# Patient Record
Sex: Female | Born: 1995 | Race: White | Hispanic: No | Marital: Single | Smoking: Never smoker
Health system: Southern US, Community
[De-identification: ages and names within clinical notes are randomized; demographics above are authoritative.]

## PROBLEM LIST (undated history)

## (undated) ENCOUNTER — Ambulatory Visit: Admission: EM | Payer: Medicaid Other

## (undated) DIAGNOSIS — F909 Attention-deficit hyperactivity disorder, unspecified type: Secondary | ICD-10-CM

## (undated) DIAGNOSIS — Z8619 Personal history of other infectious and parasitic diseases: Secondary | ICD-10-CM

## (undated) DIAGNOSIS — F329 Major depressive disorder, single episode, unspecified: Secondary | ICD-10-CM

## (undated) DIAGNOSIS — F319 Bipolar disorder, unspecified: Secondary | ICD-10-CM

## (undated) DIAGNOSIS — F32A Depression, unspecified: Secondary | ICD-10-CM

## (undated) DIAGNOSIS — G479 Sleep disorder, unspecified: Secondary | ICD-10-CM

## (undated) HISTORY — DX: Personal history of other infectious and parasitic diseases: Z86.19

## (undated) HISTORY — PX: NO PAST SURGERIES: SHX2092

---
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
## 2017-11-14 ENCOUNTER — Encounter (HOSPITAL_COMMUNITY): Payer: Self-pay | Admitting: Behavioral Health

## 2017-11-14 ENCOUNTER — Inpatient Hospital Stay (HOSPITAL_COMMUNITY)
Admission: AD | Admit: 2017-11-14 | Discharge: 2017-11-18 | DRG: 885 | Disposition: A | Discharge status: Home / Self Care | Payer: Federal, State, Local not specified - Other | Source: Intra-hospital | Attending: Psychiatry | Admitting: Psychiatry

## 2017-11-14 ENCOUNTER — Encounter (HOSPITAL_COMMUNITY): Payer: Self-pay

## 2017-11-14 ENCOUNTER — Emergency Department (HOSPITAL_COMMUNITY)
Admission: EM | Admit: 2017-11-14 | Discharge: 2017-11-14 | Disposition: A | Discharge status: Other Acute Inpatient Hospital | Payer: Self-pay | Attending: Emergency Medicine | Admitting: Emergency Medicine

## 2017-11-14 ENCOUNTER — Other Ambulatory Visit: Payer: Self-pay

## 2017-11-14 DIAGNOSIS — F332 Major depressive disorder, recurrent severe without psychotic features: Secondary | ICD-10-CM | POA: Diagnosis present

## 2017-11-14 DIAGNOSIS — Z915 Personal history of self-harm: Secondary | ICD-10-CM

## 2017-11-14 DIAGNOSIS — Z630 Problems in relationship with spouse or partner: Secondary | ICD-10-CM | POA: Diagnosis not present

## 2017-11-14 DIAGNOSIS — G4700 Insomnia, unspecified: Secondary | ICD-10-CM | POA: Diagnosis present

## 2017-11-14 DIAGNOSIS — R45851 Suicidal ideations: Secondary | ICD-10-CM | POA: Diagnosis present

## 2017-11-14 DIAGNOSIS — J45909 Unspecified asthma, uncomplicated: Secondary | ICD-10-CM | POA: Diagnosis present

## 2017-11-14 DIAGNOSIS — Z818 Family history of other mental and behavioral disorders: Secondary | ICD-10-CM | POA: Diagnosis not present

## 2017-11-14 DIAGNOSIS — Z8659 Personal history of other mental and behavioral disorders: Secondary | ICD-10-CM | POA: Diagnosis not present

## 2017-11-14 DIAGNOSIS — F411 Generalized anxiety disorder: Secondary | ICD-10-CM | POA: Diagnosis present

## 2017-11-14 DIAGNOSIS — F909 Attention-deficit hyperactivity disorder, unspecified type: Secondary | ICD-10-CM | POA: Insufficient documentation

## 2017-11-14 LAB — SALICYLATE LEVEL

## 2017-11-14 LAB — COMPREHENSIVE METABOLIC PANEL
ALBUMIN: 4.3 g/dL (ref 3.5–5.0)
ALK PHOS: 79 U/L (ref 38–126)
ALT: 18 U/L (ref 0–44)
ANION GAP: 9 (ref 5–15)
AST: 21 U/L (ref 15–41)
BUN: 9 mg/dL (ref 6–20)
CHLORIDE: 111 mmol/L (ref 98–111)
CO2: 22 mmol/L (ref 22–32)
Calcium: 9.6 mg/dL (ref 8.9–10.3)
Creatinine, Ser: 0.74 mg/dL (ref 0.44–1.00)
GFR calc Af Amer: >60 mL/min (ref >60)
GFR calc non Af Amer: >60 mL/min (ref >60)
GLUCOSE: 92 mg/dL (ref 70–99)
POTASSIUM: 3.9 mmol/L (ref 3.5–5.1)
SODIUM: 142 mmol/L (ref 135–145)
Total Bilirubin: 1 mg/dL (ref 0.3–1.2)
Total Protein: 7.5 g/dL (ref 6.5–8.1)

## 2017-11-14 LAB — ACETAMINOPHEN LEVEL

## 2017-11-14 LAB — RAPID URINE DRUG SCREEN, HOSP PERFORMED
Amphetamines: NOT DETECTED
BARBITURATES: NOT DETECTED
BENZODIAZEPINES: NOT DETECTED
COCAINE: NOT DETECTED
OPIATES: NOT DETECTED
Tetrahydrocannabinol: NOT DETECTED

## 2017-11-14 LAB — CBC
HCT: 39.9 % (ref 36.0–46.0)
HEMOGLOBIN: 13.9 g/dL (ref 12.0–15.0)
MCH: 30.6 pg (ref 26.0–34.0)
MCHC: 34.8 g/dL (ref 30.0–36.0)
MCV: 87.9 fL (ref 78.0–100.0)
Platelets: 214 10*3/uL (ref 150–400)
RBC: 4.54 MIL/uL (ref 3.87–5.11)
RDW: 13 % (ref 11.5–15.5)
WBC: 9.7 10*3/uL (ref 4.0–10.5)

## 2017-11-14 LAB — ETHANOL: Alcohol, Ethyl (B): <10 mg/dL (ref <10)

## 2017-11-14 LAB — I-STAT BETA HCG BLOOD, ED (MC, WL, AP ONLY): I-stat hCG, quantitative: <5 m[IU]/mL (ref <5)

## 2017-11-14 MED ORDER — INFLUENZA VAC SPLIT QUAD 0.5 ML IM SUSY
0.5000 mL | PREFILLED_SYRINGE | INTRAMUSCULAR | Status: DC
  Filled 2017-11-14: qty 0.5

## 2017-11-14 MED ORDER — ACETAMINOPHEN 325 MG PO TABS: 650.0000 mg | ORAL_TABLET | ORAL | Status: DC | PRN

## 2017-11-14 MED ORDER — MAGNESIUM HYDROXIDE 400 MG/5ML PO SUSP: 30.0000 mL | Freq: Every day | ORAL | Status: DC | PRN

## 2017-11-14 MED ORDER — ONDANSETRON HCL 4 MG PO TABS: 4.0000 mg | ORAL_TABLET | Freq: Three times a day (TID) | ORAL | Status: DC | PRN

## 2017-11-14 MED ORDER — ALUM & MAG HYDROXIDE-SIMETH 200-200-20 MG/5ML PO SUSP: 30.0000 mL | Freq: Four times a day (QID) | ORAL | Status: DC | PRN
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
## 2017-11-14 NOTE — ED Notes (Signed)
Pt was discharged in care of [facility] for transfer to [facility]. She signed for an received belongings, which were given to [employer] driver for transport. She verbalized readiness for transfer, was ambulatory, and in no acute distress.

## 2017-11-14 NOTE — Tx Team (Signed)
Initial Treatment Plan 11/14/2017 4:43 PM [name] AVW:[id number]    PATIENT STRESSORS: Marital or family conflict Medication change or noncompliance   PATIENT STRENGTHS: Ability for insight Capable of independent living   PATIENT IDENTIFIED PROBLEMS: "depression related to conflict with boyfriend"  "Suicidal thoughts related to increased depression"                   DISCHARGE CRITERIA:  Ability to meet basic life and health needs Adequate post-discharge living arrangements Improved stabilization in mood, thinking, and/or behavior  PRELIMINARY DISCHARGE PLAN: Attend aftercare/continuing care group Attend PHP/IOP  PATIENT/FAMILY INVOLVEMENT: This treatment plan has been presented to and reviewed with the patient, Christie Hart, and/or family member.  The patient and family have been given the opportunity to ask questions and make suggestions.  Layla Barter, RN 11/14/2017, 4:43 PM

## 2017-11-14 NOTE — ED Notes (Signed)
Oriented pt to unit after she finished speaking with TTS. Pt came in for SI. She denies HI/AVH and contracts for safety. Pt is quiet and cooperative. She is now on the phone with her mother. Will continue to monitor for needs/safety.

## 2017-11-14 NOTE — ED Notes (Signed)
Report called to Rodell Perna, Charity fundraiser, at [facility]. [facility] called.

## 2017-11-14 NOTE — BH Assessment (Signed)
[facility] Assessment Progress Note    11/14/17 Per Nanine Means, NP, Patient meets psychiatric admission criteria and has been accepted to [facility] 406-2 @ 15:00.

## 2017-11-14 NOTE — Progress Notes (Signed)
D: Pt stayed in room majority of the evening.  A: Pt was offered support and encouragement. Pt was given scheduled medications. Pt was encourage to attend groups. Q 15 minute checks were done for safety.  R:  safety maintained on unit.

## 2017-11-14 NOTE — Progress Notes (Signed)
Adult Psychoeducational Group Note  Date:  11/14/2017 Time:  10:27 PM  Group Topic/Focus:  Wrap-Up Group:   The focus of this group is to help patients review their daily goal of treatment and discuss progress on daily workbooks.  Additional Comments:  Pt did not attend group.   Berlin Hun 11/14/2017, 10:27 PM

## 2017-11-14 NOTE — ED Notes (Signed)
One bag in locker 28.
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
## 2017-11-15 DIAGNOSIS — Z818 Family history of other mental and behavioral disorders: Secondary | ICD-10-CM

## 2017-11-15 MED ORDER — MIRTAZAPINE 7.5 MG PO TABS
7.5000 mg | ORAL_TABLET | Freq: Every day | ORAL | Status: DC
  Administered 2017-11-16 – 2017-11-17 (×2): 7.5 mg via ORAL
  Filled 2017-11-15: qty 1
  Filled 2017-11-15: qty 7
  Filled 2017-11-15 (×6): qty 1

## 2017-11-15 MED ORDER — LORAZEPAM 0.5 MG PO TABS
0.5000 mg | ORAL_TABLET | Freq: Four times a day (QID) | ORAL | Status: DC | PRN
  Administered 2017-11-16: 0.5 mg via ORAL
  Filled 2017-11-15: qty 1

## 2017-11-15 NOTE — BHH Counselor (Signed)
Adult Comprehensive Assessment  Patient ID: Christie Hart, female   DOB: 1995-11-09, 22 y.o.   MRN: [id number]  Information Source: Information source: Patient  Current Stressors:  Patient states their primary concerns and needs for treatment are:: "Help me make good decisions about this relationship." Patient states their goals for this hospitilization and ongoing recovery are:: Handle my depression and anxiety better Social relationships: Pt reports problems in her relationship with boyfriend, arguing, he thinks she is being unfaithful.    Living/Environment/Situation:  Living Arrangements: Parent, Spouse/significant other Living conditions (as described by patient or guardian): There is some conflict between pt boyfriend and pt mom. Who else lives in the home?: mother, step father, pt boyfriend How long has patient lived in current situation?: 3 months What is atmosphere in current home: Chaotic, Supportive  Family History:  Marital status: Long term relationship Long term relationship, how long?: 18 months What types of issues is patient dealing with in the relationship?: boyfriend worried pt is being unfaithful, very suspicious Are you sexually active?: Yes What is your sexual orientation?: heterosexual Has your sexual activity been affected by drugs, alcohol, medication, or emotional stress?: no Does patient have children?: No  Childhood History:  Additional childhood history information: Parents divorced when pt was 36.  With mom after that.  Mom remarried quickly.  Good childhood overall.   Description of patient's relationship with caregiver when they were a child: mom: good, dad: good Patient's description of current relationship with people who raised him/her: mom: good, dad: lives in [place], not a lot of contact, but good relationship How were you disciplined when you got in trouble as a child/adolescent?: appropriate discipline Does patient have siblings?:  Yes Number of Siblings: 1 Description of patient's current relationship with siblings: older sister, she is with father in [place], no recent contact.  "wont talk to me or my mom" Did patient suffer any verbal/emotional/physical/sexual abuse as a child?: No Did patient suffer from severe childhood neglect?: No Has patient ever been sexually abused/assaulted/raped as an adolescent or adult?: No Was the patient ever a victim of a crime or a disaster?: No Witnessed domestic violence?: No Has patient been effected by domestic violence as an adult?: No  Education:  Highest grade of school patient has completed: HS diploma Currently a student?: No Learning disability?: Yes What learning problems does patient have?: was diagnosed--math issue  Employment/Work Situation:   Employment situation: Employed Where is patient currently employed?: 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 Did You Receive Any Psychiatric Treatment/Services While in Equities trader?: No Are There Guns or Other Weapons in Your Home?: No  Financial Resources:   Financial resources: Income from employment, Support from parents / caregiver(boyfriend works) Does patient have a Lawyer or guardian?: No  Alcohol/Substance Abuse:   What has been your use of drugs/alcohol within the last 12 months?: alcohol: pt denies, drugs: pt denies If attempted suicide, did drugs/alcohol play a role in this?: No Alcohol/Substance Abuse Treatment Hx: Denies past history Has alcohol/substance abuse ever caused legal problems?: No  Social Support System:   Conservation officer, nature Support System: Fair Development worker, community Support System: mom Type of faith/religion: none How does patient's faith help to cope with current illness?: na  Leisure/Recreation:    Leisure and Hobbies: watch movies, fish  Strengths/Needs:  What is the patient's perception of their strengths?: building friendships, working, having goals Patient states they can use these personal strengths during their treatment to contribute to their recovery: trying to figure out what she wants in her current relationship Patient states these barriers may affect/interfere with their treatment: none Patient states these barriers may affect their return to the community: none Other important information patient would like considered in planning for their treatment: none  Discharge Plan:   Currently receiving community mental health services: No Patient states concerns and preferences for aftercare planning are: Pt would like to go to [place].  Went to [facility] services before and had to switch therapists all the time. Patient states they will know when they are safe and ready for discharge when: when I can handle my depression and anxiety Does patient have access to transportation?: Yes Does patient have financial barriers related to discharge medications?: Yes Patient description of barriers related to discharge medications: no insurance Will patient be returning to same living situation after discharge?: Yes  Summary/Recommendations:   Summary and Recommendations (to be completed by the evaluator): Pt is 22 year old female from [place].  Pt is diagnosed with major depressive disorder and was admitted due to increased depression and suicidal ideation.  Pt reports primary stressor is her relationship with her boyfriend.  Recommendations for pt include crisis stabilization, therapeutic milieu, attend and participate in groups, medication management, and development of comprehensive mental wellness plan.  Lorri Frederick. 11/15/2017

## 2017-11-15 NOTE — BHH Suicide Risk Assessment (Signed)
[facility] Admission Suicide Risk Assessment   Nursing information obtained from:  Patient Demographic factors:  Low socioeconomic status, Adolescent or young adult, Caucasian Current Mental Status:  Suicidal ideation indicated by patient, Suicidal ideation indicated by others, Intention to act on suicide plan Loss Factors:  NA Historical Factors:  Prior suicide attempts, Impulsivity Risk Reduction Factors:  Sense of responsibility to family, Living with another person, especially a relative  Total Time spent with patient: 45 minutes Principal Problem:  MDD Diagnosis:   Patient Active Problem List   Diagnosis Date Noted  . Major depressive disorder, recurrent severe without psychotic features (HCC) [F33.2] 11/14/2017   Subjective Data:   Continued Clinical Symptoms:  Alcohol Use Disorder Identification Test Final Score (AUDIT): 0 The "Alcohol Use Disorders Identification Test", Guidelines for Use in Primary Care, Second Edition.  World Science writer [facility]). Score between 0-7:  no or low risk or alcohol related problems. Score between 8-15:  moderate risk of alcohol related problems. Score between 16-19:  high risk of alcohol related problems. Score 20 or above:  warrants further diagnostic evaluation for alcohol dependence and treatment.   CLINICAL FACTORS:  22 year old single female, lives with boyfriend, parents. Presented voluntarily due to increased depression, anxiety, passive SI in the context of significant stressors, namely frequent arguments with her boyfriend, whom she states has been jealous , distrustful, accusatory. She states she impulsively ingested window cleaner chemical product 2 weeks ago during an argument, but did not consult treatment at the time   Psychiatric Specialty Exam: Physical Exam  ROS  Blood pressure 124/77, pulse (!) 118, temperature 99 F (37.2 C), temperature source Oral, resp. rate 20, height 5\' 8"  (1.727 m), weight 58.5 kg.Body mass index is  19.61 kg/m.  See admit note MSE   COGNITIVE FEATURES THAT CONTRIBUTE TO RISK:  Closed-mindedness and Loss of executive function    SUICIDE RISK:   Moderate:  Frequent suicidal ideation with limited intensity, and duration, some specificity in terms of plans, no associated intent, good self-control, limited dysphoria/symptomatology, some risk factors present, and identifiable protective factors, including available and accessible social support.  PLAN OF CARE: Patient will be admitted to inpatient psychiatric unit for stabilization and safety. Will provide and encourage milieu participation. Provide medication management and maked adjustments as needed.  Will follow daily.    I certify that inpatient services furnished can reasonably be expected to improve the patient's condition.   Craige Cotta, MD 11/15/2017, 2:39 PM

## 2017-11-15 NOTE — H&P (Signed)
Psychiatric Admission Assessment Adult  Patient Identification: Christie Hart MRN:  [id number] Date of Evaluation:  11/15/2017 Chief Complaint:  " I just could not take it anymore" Principal Diagnosis:  MDD, without psychotic features  Diagnosis:   Patient Active Problem List   Diagnosis Date Noted  . Major depressive disorder, recurrent severe without psychotic features ([place]) [F33.2] 11/14/2017   History of Present Illness: 22 year old single female, lives with [place], boyfriend. States she felt acutely depressed and anxious yesterday in the context of argument with her BF. States she had already been feeling depressed over recent weeks, with thoughts of wanting to " just leave , disappear", but denies having had formed or discrete  suicidal plans or intentions. She has also had episodes of panic symptoms, with sense of shortness of breath, chest tightness and severe anxiety. States that about two weeks ago she impulsively ingested some Windex during an argument- states she did not consult at the time. Reports she has been facing significant stress regarding her relationship with her boyfriend, whom she describes as jealous, accusing her of infidelity, resulting in " a lot of arguing . States he insults her , calling her names, looking for marks or clues on her body to determine if she has been with someone else, but has not been physically assaultive towards her.  Endorses some neuro-vegetative symptoms of depression as below  Associated Signs/Symptoms: Depression Symptoms:  depressed mood, insomnia, suicidal thoughts without plan, anxiety, (Hypo) Manic Symptoms:  Denies and none noted Anxiety Symptoms:  Increased anxiety recently , with increased worry and panic episodes  Psychotic Symptoms:  Denies  PTSD Symptoms: Denies  Total Time spent with patient: 45 minutes  Past Psychiatric History:  No prior psychiatric admissions, states she has never attempted suicide, no  history of self cutting or of self injurious behaviors , no history of violence , states she has had prior episodes of depression, usually lasting " a few days". Denies history of mania, denies history of PTSD, denies history of violence, denies history of eating disorder . Denies agoraphobia or social anxiety. In the past was  treated with Seroquel for " my mood and because I had difficulty sleeping".   Is the patient at risk to self? Yes.    Has the patient been a risk to self in the past 6 months? No.  Has the patient been a risk to self within the distant past? No.  Is the patient a risk to others? No.  Has the patient been a risk to others in the past 6 months? No.  Has the patient been a risk to others within the distant past? No.   Prior Inpatient Therapy:  denies  Prior Outpatient Therapy:  no current outpatient treatment.  Alcohol Screening: 1. How often do you have a drink containing alcohol?: Never 2. How many drinks containing alcohol do you have on a typical day when you are drinking?: 1 or 2 3. How often do you have six or more drinks on one occasion?: Never AUDIT-C Score: 0 9. Have you or someone else been injured as a result of your drinking?: No 10. Has a relative or friend or a doctor or another health worker been concerned about your drinking or suggested you cut down?: No Alcohol Use Disorder Identification Test Final Score (AUDIT): 0 Intervention/Follow-up: AUDIT Score <7 follow-up not indicated Substance Abuse History in the last 12 months: denies alcohol or drug abuse  Consequences of Substance Abuse: Denies  Previous Psychotropic  Medications: was not taking any psychiatric medications prior to admission, states that in the past she has been on Seroquel, Strattera, Klonopin 1-2 years ago. Psychological Evaluations: no  Past Medical History: Denies medical illnesses, NKDA  Past Medical History:  Diagnosis Date  . ADHD (attention deficit hyperactivity disorder)   .  Bipolar affective disorder ([place])   . Depression   . Sleep difficulties    History reviewed. No pertinent surgical history. Family History: parents separated when patient 16. Lives with mother and stepfather. Has one sister who lives in [place] with biological father. Family Psychiatric  History: sister and mother have history of depression, no suicides in family, no alcohol abuse in family  Tobacco Screening: Have you used any form of tobacco in the last 30 days? (Cigarettes, Smokeless Tobacco, Cigars, and/or Pipes): No Social History: 21, single, no children, lives with Control and instrumentation engineer and her boyfriend, employed at a [employer]. Denies legal issues. Social History   Substance and Sexual Activity  Alcohol Use No     Social History   Substance and Sexual Activity  Drug Use No    Additional Social History:  Allergies:  No Known Allergies Lab Results:  Results for orders placed or performed during the hospital encounter of 11/14/17 (from the past 48 hour(s))  Comprehensive metabolic panel     Status: None   Collection Time: 11/14/17 11:47 AM  Result Value Ref Range   Sodium 142 135 - 145 mmol/L   Potassium 3.9 3.5 - 5.1 mmol/L   Chloride 111 98 - 111 mmol/L   CO2 22 22 - 32 mmol/L   Glucose, Bld 92 70 - 99 mg/dL   BUN 9 6 - 20 mg/dL   Creatinine, Ser 0.74 0.44 - 1.00 mg/dL   Calcium 9.6 8.9 - 10.3 mg/dL   Total Protein 7.5 6.5 - 8.1 g/dL   Albumin 4.3 3.5 - 5.0 g/dL   AST 21 15 - 41 U/L   ALT 18 0 - 44 U/L   Alkaline Phosphatase 79 38 - 126 U/L   Total Bilirubin 1.0 0.3 - 1.2 mg/dL   GFR calc non Af Amer >60 >60 mL/min   GFR calc Af Amer >60 >60 mL/min    Comment: (NOTE) The eGFR has been calculated using the CKD EPI equation. This calculation has not been validated in all clinical situations. eGFR's persistently <60 mL/min signify possible Chronic Kidney Disease.    Anion gap 9 5 - 15    Comment: Performed at [facility], [place] [address],  [place], [place] [address]  Ethanol     Status: None   Collection Time: 11/14/17 11:47 AM  Result Value Ref Range   Alcohol, Ethyl (B) <10 <10 mg/dL    Comment: (NOTE) Lowest detectable limit for serum alcohol is 10 mg/dL. For medical purposes only. Performed at [facility], [place] [address] [place], [place] [address]   Salicylate level     Status: None   Collection Time: 11/14/17 11:47 AM  Result Value Ref Range   Salicylate Lvl <9.3 2.8 - 30.0 mg/dL    Comment: Performed at [facility], [place] [address], [place], [place] [address]  Acetaminophen level     Status: Abnormal   Collection Time: 11/14/17 11:47 AM  Result Value Ref Range   Acetaminophen (Tylenol), Serum <10 (L) 10 - 30 ug/mL    Comment: (NOTE) Therapeutic concentrations vary significantly. A range of 10-30 ug/mL  may be an effective concentration for many  patients. However, some  are best treated at concentrations outside of this range. Acetaminophen concentrations >150 ug/mL at 4 hours after ingestion  and >50 ug/mL at 12 hours after ingestion are often associated with  toxic reactions. Performed at [facility], [place] [address], [place], [place] [address]   cbc     Status: None   Collection Time: 11/14/17 11:47 AM  Result Value Ref Range   WBC 9.7 4.0 - 10.5 K/uL   RBC 4.54 3.87 - 5.11 MIL/uL   Hemoglobin 13.9 12.0 - 15.0 g/dL   HCT 39.9 36.0 - 46.0 %   MCV 87.9 78.0 - 100.0 fL   MCH 30.6 26.0 - 34.0 pg   MCHC 34.8 30.0 - 36.0 g/dL   RDW 13.0 11.5 - 15.5 %   Platelets 214 150 - 400 K/uL    Comment: Performed at [facility], [place] [address], [place], [place] [address]  Rapid urine drug screen (hospital performed)     Status: None   Collection Time: 11/14/17 11:47 AM  Result Value Ref Range   Opiates NONE DETECTED NONE DETECTED   Cocaine NONE DETECTED NONE DETECTED   Benzodiazepines NONE DETECTED NONE DETECTED   Amphetamines NONE DETECTED  NONE DETECTED   Tetrahydrocannabinol NONE DETECTED NONE DETECTED   Barbiturates NONE DETECTED NONE DETECTED    Comment: (NOTE) DRUG SCREEN FOR MEDICAL PURPOSES ONLY.  IF CONFIRMATION IS NEEDED FOR ANY PURPOSE, NOTIFY LAB WITHIN 5 DAYS. LOWEST DETECTABLE LIMITS FOR URINE DRUG SCREEN Drug Class                     Cutoff (ng/mL) Amphetamine and metabolites    1000 Barbiturate and metabolites    200 Benzodiazepine                 979 Tricyclics and metabolites     300 Opiates and metabolites        300 Cocaine and metabolites        300 THC                            50 Performed at [facility], [place] [address], [place],  [address]   I-Stat beta hCG blood, ED     Status: None   Collection Time: 11/14/17 11:54 AM  Result Value Ref Range   I-stat hCG, quantitative <5.0 <5 mIU/mL   Comment 3            Comment:   GEST. AGE      CONC.  (mIU/mL)   <=1 WEEK        5 - 50     2 WEEKS       50 - 500     3 WEEKS       100 - 10,000     4 WEEKS     1,000 - 30,000        FEMALE AND NON-PREGNANT FEMALE:     LESS THAN 5 mIU/mL     Blood Alcohol level:  Lab Results  Component Value Date   ETH <10 94/17/4081    Metabolic Disorder Labs:  No results found for: HGBA1C, MPG No results found for: PROLACTIN No results found for: CHOL, TRIG, HDL, CHOLHDL, VLDL, LDLCALC  Current Medications: Current Facility-Administered Medications  Medication Dose Route Frequency Provider Last Rate Last Dose  . acetaminophen (TYLENOL) tablet 650 mg  650 mg Oral Q4H  PRN Patrecia Pour, NP      . alum & mag hydroxide-simeth (MAALOX/MYLANTA) 200-200-20 MG/5ML suspension 30 mL  30 mL Oral Q6H PRN Patrecia Pour, NP      . Influenza vac split quadrivalent PF (FLUARIX) injection 0.5 mL  0.5 mL Intramuscular Tomorrow-1000 Cobos, Fernando A, MD      . magnesium hydroxide (MILK OF MAGNESIA) suspension 30 mL  30 mL Oral Daily PRN Patrecia Pour, NP      . ondansetron [facility]) tablet 4  mg  4 mg Oral Q8H PRN Patrecia Pour, NP       PTA Medications: Medications Prior to Admission  Medication Sig Dispense Refill Last Dose  . albuterol (PROVENTIL HFA;VENTOLIN HFA) 108 (90 BASE) MCG/ACT inhaler Inhale 1-2 puffs into the lungs every 6 (six) hours as needed for wheezing. (Patient not taking: Reported on 11/14/2017) 1 Inhaler 0 Not Taking at Unknown time  . predniSONE (DELTASONE) 20 MG tablet Take 2 tablets (40 mg total) by mouth daily. (Patient not taking: Reported on 11/14/2017) 10 tablet 0 Not Taking at Unknown time    Musculoskeletal: Strength & Muscle Tone: within normal limits Gait & Station: normal Patient leans: N/A  Psychiatric Specialty Exam: Physical Exam  Review of Systems  Constitutional: Negative.   HENT: Negative.   Eyes: Negative.   Respiratory: Negative.   Cardiovascular: Negative.   Gastrointestinal: Negative.   Genitourinary: Negative.   Musculoskeletal: Negative.   Skin: Negative.   Neurological: Negative for dizziness, seizures and headaches.  Endo/Heme/Allergies: Negative.   Psychiatric/Behavioral: Positive for depression and suicidal ideas. The patient is nervous/anxious.   All other systems reviewed and are negative.   Blood pressure 124/77, pulse (!) 118, temperature 99 F (37.2 C), temperature source Oral, resp. rate 20, height '5\' 8"'$  (1.727 m), weight 58.5 kg.Body mass index is 19.61 kg/m.  General Appearance: Fairly Groomed  Eye Contact:  Good  Speech:  Normal Rate  Volume:  Decreased  Mood:  depressed, anxious, states feeling better today  Affect:  anxious, vaguely constricted   Thought Process:  Linear and Descriptions of Associations: Intact  Orientation:  Other:  fully alert and attentive   Thought Content:  no hallucinations,no delusions, not internally preoccupied   Suicidal Thoughts:  No denies suicidal ideations , denies self injurious ideations, no homicidal ideations  Homicidal Thoughts:  No  Memory:  recent and remote grossly  intact   Judgement:  Fair  Insight:  Fair  Psychomotor Activity:  Decreased  Concentration:  Concentration: Good and Attention Span: Good  Recall:  Good  Fund of Knowledge:  Good  Language:  Good  Akathisia:  Negative  Handed:  Right  AIMS (if indicated):     Assets:  Communication Skills Desire for Improvement Resilience  ADL's:  Intact  Cognition:  WNL  Sleep:  Number of Hours: 6.25    Treatment Plan Summary: Daily contact with patient to assess and evaluate symptoms and progress in treatment, Medication management, Plan inpatient treatment and medications as below  Observation Level/Precautions:  15 minute checks  Laboratory:  as needed   Psychotherapy:  [place], group therapy   Medications:  Agrees to antidepressant medication trial- we discussed trial- agrees to REMERON trial Start Remeron 7.5 mgrs QHS for depression Ativan PRN for anxiety   Consultations:  As needed   Discharge Concerns:  -  Estimated LOS: 4-5 days   Other:     Physician Treatment Plan for Primary Diagnosis:  MDD Long Term Goal(s): Improvement  in symptoms so as ready for discharge  Short Term Goals: Ability to identify changes in lifestyle to reduce recurrence of condition will improve and Ability to maintain clinical measurements within normal limits will improve  Physician Treatment Plan for Secondary Diagnosis: MDD  Long Term Goal(s): Improvement in symptoms so as ready for discharge  Short Term Goals: Ability to identify changes in lifestyle to reduce recurrence of condition will improve, Ability to verbalize feelings will improve, Ability to disclose and discuss suicidal ideas, Ability to demonstrate self-control will improve, Ability to identify and develop effective coping behaviors will improve and Ability to maintain clinical measurements within normal limits will improve  I certify that inpatient services furnished can reasonably be expected to improve the patient's condition.    Jenne Campus, MD 9/9/20192:08 PM

## 2017-11-15 NOTE — Plan of Care (Signed)
Problem: Education: Goal: Knowledge of [facility] Education information/materials will improve Outcome: Progressing   Problem: Safety: Goal: Periods of time without injury will increase Outcome: Progressing D: Pt awake and visible in dayroom this afternoon. Isolative to room majority of this shift. A & O X4. Denies SI, HI, AVH and pain when assessed. Presents guarded with flat affect and depressed mood. Rates her depression 2/10, anxiety and hopelessness 0/10. Reports she slept well last night with poor appetite, normal energy and good concentration level. Declined her scheduled flu vaccine when offered this AM "I want to think about it first because last time I took it I got sick". Pt updated on changes made to treatment regimen (Labs, meds). Pt's goal this shift is "How to handle my relationship with my boyfriend".  A: Emotional support and availability provided to pt as needed. Encouraged pt to voice concerns, attend to ADLs and comply with current treatment regimen. Safety checks maintained without self harm gestures.  R: Pt remains safe on unit. Denies concerns at this time.

## 2017-11-15 NOTE — Progress Notes (Signed)
Recreation Therapy Notes  Date: 9.9.19 Time: 0930 Location: 300 [address] Dayroom  Group Topic: Stress Management  Goal Area(s) Addresses:  Patient will verbalize importance of using healthy stress management.  Patient will identify positive emotions associated with healthy stress management.   Intervention: Stress Management  Activity :  Mountain Meditation.  LRT introduced the stress management technique of meditation.  Patients were to listen as meditation played to engage in the activity.  Education: Stress Management, Discharge Planning.   Education Outcome: Acknowledges edcuation/In group clarification offered/Needs additional education  Clinical Observations/Feedback: Pt did not attend group.    Linzi Ohlinger, LRT/CTRS        Jaysha Lasure A 11/15/2017 12:18 PM 

## 2017-11-15 NOTE — Tx Team (Addendum)
Interdisciplinary Treatment and Diagnostic Plan Update  11/15/2017 Time of Session: 1053 Christie Hart MRN: [id number]  Principal Diagnosis: <principal problem not specified>  Secondary Diagnoses: Active Problems:   Major depressive disorder, recurrent severe without psychotic features ([place])   Current Medications:  Current Facility-Administered Medications  Medication Dose Route Frequency Provider Last Rate Last Dose  . acetaminophen (TYLENOL) tablet 650 mg  650 mg Oral Q4H PRN Patrecia Pour, NP      . alum & mag hydroxide-simeth (MAALOX/MYLANTA) 200-200-20 MG/5ML suspension 30 mL  30 mL Oral Q6H PRN Patrecia Pour, NP      . Influenza vac split quadrivalent PF (FLUARIX) injection 0.5 mL  0.5 mL Intramuscular Tomorrow-1000 Cobos, Fernando A, MD      . LORazepam (ATIVAN) tablet 0.5 mg  0.5 mg Oral Q6H PRN Cobos, Myer Peer, MD      . magnesium hydroxide (MILK OF MAGNESIA) suspension 30 mL  30 mL Oral Daily PRN Patrecia Pour, NP      . mirtazapine (REMERON) tablet 7.5 mg  7.5 mg Oral QHS Cobos, Myer Peer, MD      . ondansetron (ZOFRAN) tablet 4 mg  4 mg Oral Q8H PRN Patrecia Pour, NP       PTA Medications: Medications Prior to Admission  Medication Sig Dispense Refill Last Dose  . albuterol (PROVENTIL HFA;VENTOLIN HFA) 108 (90 BASE) MCG/ACT inhaler Inhale 1-2 puffs into the lungs every 6 (six) hours as needed for wheezing. (Patient not taking: Reported on 11/14/2017) 1 Inhaler 0 Not Taking at Unknown time  . predniSONE (DELTASONE) 20 MG tablet Take 2 tablets (40 mg total) by mouth daily. (Patient not taking: Reported on 11/14/2017) 10 tablet 0 Not Taking at Unknown time    Patient Stressors: Marital or family conflict Medication change or noncompliance  Patient Strengths: Ability for insight Capable of independent living  Treatment Modalities: Medication Management, Group therapy, Case management,  1 to 1 session with clinician, Psychoeducation, Recreational  therapy.   Physician Treatment Plan for Primary Diagnosis: <principal problem not specified> Long Term Goal(s): Improvement in symptoms so as ready for discharge Improvement in symptoms so as ready for discharge   Short Term Goals: Ability to identify changes in lifestyle to reduce recurrence of condition will improve Ability to maintain clinical measurements within normal limits will improve Ability to identify changes in lifestyle to reduce recurrence of condition will improve Ability to verbalize feelings will improve Ability to disclose and discuss suicidal ideas Ability to demonstrate self-control will improve Ability to identify and develop effective coping behaviors will improve Ability to maintain clinical measurements within normal limits will improve  Medication Management: Evaluate patient's response, side effects, and tolerance of medication regimen.  Therapeutic Interventions: 1 to 1 sessions, Unit Group sessions and Medication administration.  Evaluation of Outcomes: Not Met  Physician Treatment Plan for Secondary Diagnosis: Active Problems:   Major depressive disorder, recurrent severe without psychotic features ([place])  Long Term Goal(s): Improvement in symptoms so as ready for discharge Improvement in symptoms so as ready for discharge   Short Term Goals: Ability to identify changes in lifestyle to reduce recurrence of condition will improve Ability to maintain clinical measurements within normal limits will improve Ability to identify changes in lifestyle to reduce recurrence of condition will improve Ability to verbalize feelings will improve Ability to disclose and discuss suicidal ideas Ability to demonstrate self-control will improve Ability to identify and develop effective coping behaviors will improve Ability to maintain clinical measurements  within normal limits will improve     Medication Management: Evaluate patient's response, side effects, and  tolerance of medication regimen.  Therapeutic Interventions: 1 to 1 sessions, Unit Group sessions and Medication administration.  Evaluation of Outcomes: Not Met   RN Treatment Plan for Primary Diagnosis: <principal problem not specified> Long Term Goal(s): Knowledge of disease and therapeutic regimen to maintain health will improve  Short Term Goals: Ability to identify and develop effective coping behaviors will improve and Compliance with prescribed medications will improve  Medication Management: RN will administer medications as ordered by provider, will assess and evaluate patient's response and provide education to patient for prescribed medication. RN will report any adverse and/or side effects to prescribing provider.  Therapeutic Interventions: 1 on 1 counseling sessions, Psychoeducation, Medication administration, Evaluate responses to treatment, Monitor vital signs and CBGs as ordered, Perform/monitor CIWA, COWS, AIMS and Fall Risk screenings as ordered, Perform wound care treatments as ordered.  Evaluation of Outcomes: Not Met   LCSW Treatment Plan for Primary Diagnosis: <principal problem not specified> Long Term Goal(s): Safe transition to appropriate next level of care at discharge, Engage patient in therapeutic group addressing interpersonal concerns.  Short Term Goals: Engage patient in aftercare planning with referrals and resources, Increase social support and Increase skills for wellness and recovery  Therapeutic Interventions: Assess for all discharge needs, 1 to 1 time with Social worker, Explore available resources and support systems, Assess for adequacy in community support network, Educate family and significant other(s) on suicide prevention, Complete Psychosocial Assessment, Interpersonal group therapy.  Evaluation of Outcomes: Not Met   Progress in Treatment: Attending groups: No. Participating in groups: No. Taking medication as prescribed:  No. Toleration medication: No. Family/Significant other contact made: No, will contact:  when given permission Patient understands diagnosis: Yes. Discussing patient identified problems/goals with staff: Yes. Medical problems stabilized or resolved: Yes. Denies suicidal/homicidal ideation: Yes. Issues/concerns per patient self-inventory: No. Other: none  New problem(s) identified: No, Describe:  none  New Short Term/Long Term Goal(s):  Patient Goals:  "handle my depression and anxiety better.  Get along better with boyfriend and family"  Discharge Plan or Barriers:   Reason for Continuation of Hospitalization: Depression Medication stabilization  Estimated Length of Stay: 3-5 days.   Attendees: Patient: Christie Hart 11/15/2017   Physician: Dr. Parke Poisson, MD 11/15/2017   Nursing: Mayra Neer, RN 11/15/2017   RN Care Manager: 11/15/2017   Social Worker: Lurline Idol, LCSW 11/15/2017   Recreational Therapist:  11/15/2017   Other:  11/15/2017   Other:  11/15/2017   Other: 11/15/2017        Scribe for Treatment Team: Joanne Chars, [place] 11/15/2017 3:43 PM

## 2017-11-16 DIAGNOSIS — F332 Major depressive disorder, recurrent severe without psychotic features: Principal | ICD-10-CM

## 2017-11-16 LAB — TSH: TSH: 0.751 u[IU]/mL (ref 0.350–4.500)

## 2017-11-16 NOTE — [facility] Group Notes (Signed)
LCSW Group Therapy Note 11/16/2017 11:23 AM  Type of Therapy and Topic: Group Therapy: Overcoming Obstacles  Participation Level: Active  Description of Group:  In this group patients will be encouraged to explore what they see as obstacles to their own wellness and recovery. They will be guided to discuss their thoughts, feelings, and behaviors related to these obstacles. The group will process together ways to cope with barriers, with attention given to specific choices patients can make. Each patient will be challenged to identify changes they are motivated to make in order to overcome their obstacles. This group will be process-oriented, with patients participating in exploration of their own experiences as well as giving and receiving support and challenge from other group members.  Therapeutic Goals: 1. Patient will identify personal and current obstacles as they relate to admission. 2. Patient will identify barriers that currently interfere with their wellness or overcoming obstacles.  3. Patient will identify feelings, thought process and behaviors related to these barriers. 4. Patient will identify two changes they are willing to make to overcome these obstacles:   Summary of Patient Progress  Christie Hart was engaged and participated throughout the group session. Christie Hart reports that her current obstacle is her inability to manage negative and positive relationships in her life. Christie Hart reports that her current barrier is the conflict between her parents and her current boyfriend. Christie Hart reports she does not know what she will do to overcome this barrier because she loves her boyfriend, however she does not want to damage the relationship she has with her parents either.    Therapeutic Modalities:  Cognitive Behavioral Therapy Solution Focused Therapy Motivational Interviewing Relapse Prevention Therapy   Alcario Drought Clinical Social Worker

## 2017-11-16 NOTE — Progress Notes (Signed)
[facility] MD Progress Note  11/16/2017 4:06 PM Christie Hart  MRN:  [id number] Subjective: Patient is seen and examined.  Patient is a 22 year old female with a past psychiatric history significant for depression and anxiety.  She was admitted on 9/9 after developing suicidal ideation in the context of an argument with her boyfriend.  She states she felt as though she just wanted to "disappear".  She also has a history of panic attack symptoms.  She also has some significant impulsive behavior where she ingested some Windex after an argument last week.  She stated that she had previously been treated for anxiety depression in the past.  She had been treated for almost 9 years with Seroquel, clonazepam and Strattera.  She stated she been off those for a couple years.  We discussed the possibility of going on those.  Dr. Jama Flavors wrote for mirtazapine, but she did not take it last night.  She stated she felt better today, and had no suicidal ideation.  We discussed the need to consider being back on medicines given her previous treatment.  She stated she did stop the medicines because she lost her Medicaid. Principal Problem: <principal problem not specified> Diagnosis:   Patient Active Problem List   Diagnosis Date Noted  . Major depressive disorder, recurrent severe without psychotic features (HCC) [F33.2] 11/14/2017   Total Time spent with patient: 15 minutes  Past Psychiatric History: See admission H&P  Past Medical History:  Past Medical History:  Diagnosis Date  . ADHD (attention deficit hyperactivity disorder)   . Bipolar affective disorder (HCC)   . Depression   . Sleep difficulties    History reviewed. No pertinent surgical history. Family History: History reviewed. No pertinent family history. Family Psychiatric  History: See admission H&P Social History:  Social History   Substance and Sexual Activity  Alcohol Use No     Social History   Substance and Sexual Activity  Drug Use  No    Social History   Socioeconomic History  . Marital status: Single    Spouse name: Not on file  . Number of children: Not on file  . Years of education: Not on file  . Highest education level: Not on file  Occupational History  . Not on file  Social Needs  . Financial resource strain: Not on file  . Food insecurity:    Worry: Not on file    Inability: Not on file  . Transportation needs:    Medical: Not on file    Non-medical: Not on file  Tobacco Use  . Smoking status: Never Smoker  . Smokeless tobacco: Never Used  Substance and Sexual Activity  . Alcohol use: No  . Drug use: No  . Sexual activity: Not on file  Lifestyle  . Physical activity:    Days per week: Not on file    Minutes per session: Not on file  . Stress: Not on file  Relationships  . Social connections:    Talks on phone: Not on file    Gets together: Not on file    Attends religious service: Not on file    Active member of club or organization: Not on file    Attends meetings of clubs or organizations: Not on file    Relationship status: Not on file  Other Topics Concern  . Not on file  Social History Narrative  . Not on file   Additional Social History:  Sleep: Fair  Appetite:  Fair  Current Medications: Current Facility-Administered Medications  Medication Dose Route Frequency Provider Last Rate Last Dose  . acetaminophen (TYLENOL) tablet 650 mg  650 mg Oral Q4H PRN Charm Rings, NP      . alum & mag hydroxide-simeth (MAALOX/MYLANTA) 200-200-20 MG/5ML suspension 30 mL  30 mL Oral Q6H PRN Charm Rings, NP      . Influenza vac split quadrivalent PF (FLUARIX) injection 0.5 mL  0.5 mL Intramuscular Tomorrow-1000 Cobos, Fernando A, MD      . LORazepam (ATIVAN) tablet 0.5 mg  0.5 mg Oral Q6H PRN Cobos, Rockey Situ, MD      . magnesium hydroxide (MILK OF MAGNESIA) suspension 30 mL  30 mL Oral Daily PRN Charm Rings, NP      . mirtazapine (REMERON)  tablet 7.5 mg  7.5 mg Oral QHS Cobos, Rockey Situ, MD      . ondansetron (ZOFRAN) tablet 4 mg  4 mg Oral Q8H PRN Charm Rings, NP        Lab Results:  Results for orders placed or performed during the hospital encounter of 11/14/17 (from the past 48 hour(s))  TSH     Status: None   Collection Time: 11/16/17  6:17 AM  Result Value Ref Range   TSH 0.751 0.350 - 4.500 uIU/mL    Comment: Performed by a 3rd Generation assay with a functional sensitivity of <=0.01 uIU/mL. Performed at [facility], [address] [place], [place] [address]     Blood Alcohol level:  Lab Results  Component Value Date   ETH <10 11/14/2017    Metabolic Disorder Labs: No results found for: HGBA1C, MPG No results found for: PROLACTIN No results found for: CHOL, TRIG, HDL, CHOLHDL, VLDL, LDLCALC  Physical Findings: AIMS: Facial and Oral Movements Muscles of Facial Expression: None, normal Lips and Perioral Area: None, normal Jaw: None, normal Tongue: None, normal,Extremity Movements Upper (arms, wrists, hands, fingers): None, normal Lower (legs, knees, ankles, toes): None, normal, Trunk Movements Neck, shoulders, hips: None, normal, Overall Severity Severity of abnormal movements (highest score from questions above): None, normal Incapacitation due to abnormal movements: None, normal Patient's awareness of abnormal movements (rate only patient's report): No Awareness, Dental Status Current problems with teeth and/or dentures?: No Does patient usually wear dentures?: No  CIWA:    COWS:     Musculoskeletal: Strength & Muscle Tone: within normal limits Gait & Station: normal Patient leans: N/A  Psychiatric Specialty Exam: Physical Exam  Nursing note and vitals reviewed. Constitutional: She is oriented to person, place, and time. She appears well-developed.  HENT:  Head: Normocephalic and atraumatic.  Respiratory: Effort normal.  Neurological: She is alert and oriented to  person, place, and time.    ROS  Blood pressure 102/70, pulse (!) 127, temperature 98.9 F (37.2 C), temperature source Oral, resp. rate 18, height 5\' 8"  (1.727 m), weight 58.5 kg.Body mass index is 19.61 kg/m.  General Appearance: Casual  Eye Contact:  Fair  Speech:  Normal Rate  Volume:  Normal  Mood:  Anxious  Affect:  Congruent  Thought Process:  Coherent and Descriptions of Associations: Intact  Orientation:  Full (Time, Place, and Person)  Thought Content:  Logical  Suicidal Thoughts:  No  Homicidal Thoughts:  No  Memory:  Immediate;   Fair Recent;   Fair Remote;   Fair  Judgement:  Intact  Insight:  Lacking  Psychomotor Activity:  Normal  Concentration:  Concentration: Fair and Attention Span: Fair  Recall:  [employer] of Knowledge:  Fair  Language:  Fair  Akathisia:  Negative  Handed:  Right  AIMS (if indicated):     Assets:  Communication Skills Desire for Improvement Housing Physical Health Resilience Social Support  ADL's:  Intact  Cognition:  WNL  Sleep:  Number of Hours: 6.75     Treatment Plan Summary: Daily contact with patient to assess and evaluate symptoms and progress in treatment, Medication management and Plan : Patient is seen and examined.  Patient is a 22 year old female with the above-stated past psychiatric history who is seen in follow-up.  #1-depression/anxiety-patient refused to take the mirtazapine last night, and have asked her to consider trying it tonight.  She states she had been treated with Seroquel, Klonopin as well as Strattera in the past.  She states she only stopped them because her Medicaid ran out.  I suggested that she could probably take the mirtazapine and achieve anxiety control, depression control as well as sedation with it.  She will consider taking that tonight.  She also has lorazepam written 0.5 mg every 6 hours as needed anxiety, but she has yet to use that as well.  #2 disposition planning-the patient is already asking  for discharge, and I asked her to consider taking the medication tonight so at least we can see whether we could help her with her anxiety.  We should do that before we plan necessarily any discharge issues.  Antonieta Pert, MD 11/16/2017, 4:06 PM

## 2017-11-16 NOTE — [facility] Suicide Risk Assessment (Signed)
[facility] INPATIENT:  Family/Significant Other Suicide Prevention Education  Suicide Prevention Education:  Education Completed; Germaine Pomfret Corriveau, mother, [phone], has been identified by the patient as the family member/significant other with whom the patient will be residing, and identified as the person(s) who will aid the patient in the event of a mental health crisis (suicidal ideations/suicide attempt).  With written consent from the patient, the family member/significant other has been provided the following suicide prevention education, prior to the and/or following the discharge of the patient.  The suicide prevention education provided includes the following:  Suicide risk factors  Suicide prevention and interventions  National Suicide Hotline telephone number  [facility] assessment telephone number  [facility] Emergency Assistance 911  [facility] and/or Residential Mobile Crisis Unit telephone number  Request made of family/significant other to:  Remove weapons (e.g., guns, rifles, knives), all items previously/currently identified as safety concern.  No guns in the home, per mom.  Remove drugs/medications (over-the-counter, prescriptions, illicit drugs), all items previously/currently identified as a safety concern.  The family member/significant other verbalizes understanding of the suicide prevention education information provided.  The family member/significant other agrees to remove the items of safety concern listed above.  Per mom, boyfriend and pt moved in 3 months ago.  They argue a lot.  Boyfriend, Domingo Dimes, only works sporadically.  Alexsia has said she feels like she is supporting him by her working all the time.  Mom did not want Dylan to know that pt went to [facility] and refused to tell him--Dylan ended up punching the wall.  CSW asked about having a family meeting to discuss this and she and her husband agreed to come in Wed at 1pm to meet with pt  and CSW.  Mother is concerned about her daughter in this relationship and says boyfriend has cost her a car and other things too.  Mother just found out about pt drinking bleach in prior suicide gesture.  Lorri Frederick, LCSW 11/16/2017, 1:12 PM

## 2017-11-16 NOTE — Progress Notes (Signed)
Patient denies SI, HI and AVH this shift.  Patient has been isolative to room this shift.   Assess patient for safety, offer medications planned, engage patient in 1:1 staff talks.   Continue to monitor as planned.  Patient able to contract for safety.

## 2017-11-16 NOTE — Progress Notes (Signed)
Adult Psychoeducational Group Note  Date:  11/16/2017 Time:  3:07 AM  Group Topic/Focus:  Wrap-Up Group:   The focus of this group is to help patients review their daily goal of treatment and discuss progress on daily workbooks.  Participation Level:  Did Not Attend Christie Hart 11/16/2017, 3:07 AM

## 2017-11-16 NOTE — [facility] Group Notes (Signed)
Adult Psychoeducational Group Note  Date:  11/16/2017  Time: 3:15 PM  Group Topic/Focus: Self-Care  Participation Level:  Minimal  Participation Quality:  Minimal  Affect:  Flat, Depressed  Cognitive:  Alert and Oriented  Insight: Improving  Engagement in Group:  Developing/Improving  Modes of Intervention:  Discussion and Education  Additional Comments:  Patient completed self-care assessment and contributed to the group discussion. Patient states she would like to take time to grocery shop for herself and do laundry on a routine basis.  Marchelle Folks A Lashanna Angelo 11/16/2017 4:00 PM

## 2017-11-16 NOTE — Plan of Care (Signed)
Progress Note  D: pt found in her room; tearful. Pt's boyfriend came to visit tonight and gave her an ultimatum where either he leaves or the pt has to leave with him and move out of her parent's house. Pt states she doesn't know what to do. Pt is torn between the two. Pt allowed to express grief and anxiety. Pt states the reason she is here is to have a break from him but he found out where she was and has been visiting the last two nights. Pt denies any si/hi/ah/vh and verbally agrees to approach staff if these become apparent. Pt compliant with medication administration. A: pt provided support and encouragement. Pt given medications per protocol and standing orders. Q47m safety checks implemented and continued. R: pt safe on the unit. Will continue to monitor.   Pt progressing in the following metrics  Problem: Education: Goal: Emotional status will improve Outcome: Progressing Goal: Mental status will improve Outcome: Progressing Goal: Verbalization of understanding the information provided will improve Outcome: Progressing   Problem: Coping: Goal: Ability to verbalize frustrations and anger appropriately will improve Outcome: Progressing Goal: Ability to demonstrate self-control will improve Outcome: Progressing   Problem: Health Behavior/Discharge Planning: Goal: Compliance with treatment plan for underlying cause of condition will improve Outcome: Progressing   Problem: Physical Regulation: Goal: Ability to maintain clinical measurements within normal limits will improve Outcome: Progressing

## 2017-11-16 NOTE — Progress Notes (Signed)
D: Pt denies SI/HI/AVH. Pt is pleasant and cooperative. Pt had minimal interaction with writer this evening. Pt visible on the unit on the phone a lot this evening.  A: Pt was offered support and encouragement . Pt was encourage to attend groups. Q 15 minute checks were done for safety.  R: safety maintained on unit.  Problem: Activity: Goal: Sleeping patterns will improve Outcome: Progressing   Problem: Activity: Goal: Interest or engagement in activities will improve Outcome: Progressing

## 2017-11-16 NOTE — Progress Notes (Signed)
Adult Psychoeducational Group Note  Date:  11/16/2017 Time:  7:10 PM  Group Topic/Focus:  Goals Group:   The focus of this group is to help patients establish daily goals to achieve during treatment and discuss how the patient can incorporate goal setting into their daily lives to aide in recovery.  Participation Level:  Active  Participation Quality:  Appropriate  Affect:  Appropriate  Cognitive:  Appropriate  Insight: Appropriate  Engagement in Group:  Engaged  Modes of Intervention:  Discussion  Additional Comments:  Pt attended group and participated in discussion.  Johnice Riebe R Natale Thoma 11/16/2017, 7:10 PM

## 2017-11-17 NOTE — Progress Notes (Signed)
Adult Psychoeducational Group Note  Date:  11/17/2017 Time:  4:13 AM  Group Topic/Focus:  Wrap-Up Group:   The focus of this group is to help patients review their daily goal of treatment and discuss progress on daily workbooks.  Participation Level:  Active  Participation Quality:  Appropriate  Affect:  Appropriate  Cognitive:  Appropriate  Insight: Appropriate  Engagement in Group:  Engaged  Modes of Intervention:  Discussion  Additional Comments:  Pt stated her goal for today was to attend all groups held. Pt stated she accomplished her goal today. Pt rated her overall day was a 10. Pt stated she enjoyed listen to her peers tell their own life stories doing group.  Christie Hart 11/17/2017, 4:13 AM

## 2017-11-17 NOTE — Progress Notes (Signed)
Patient ID: Christie Hart, female   DOB: Mar 18, 1995, 22 y.o.   MRN: [id number]  D: Pt visible in the day room interacting with peers, and denies SI/HI/AVH.  Pt is calm and cooperative, and denies any concerns.  A: Pt given all meds as scheduled, and is being maintained on Q15 minute checks for safety.  R: Will maintain on Q15 minute checks and continue to monitor.

## 2017-11-17 NOTE — Plan of Care (Signed)
Progress Note  D: pt found in her bed; compliant with medication administration. Pt states she slept well. 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 Pt denies any si/hi/ah/vh and verbally agrees to approach staff if these become apparent. Pt rates her depression/hopelessness/anxiety a 0/0/0 out of 10 respectively. Pt states her family visit went ok. Her mother gave her an ultimatum as well stating that she has to make a decision before she can leave here as stated by the patient. Pt is still torn as to what she should do. A: pt provided support and encouragement. Q28m safety checks implemented and continued. R: pt safe on the unit. Will continue to monitor.   Pt progressing in the following metrics  Problem: Health Behavior/Discharge Planning: Goal: Identification of resources available to assist in meeting health care needs will improve Outcome: Progressing   Problem: Education: Goal: Knowledge of [place] [facility] Education information/materials will improve Outcome: Progressing Goal: Emotional status will improve Outcome: Progressing Goal: Mental status will improve Outcome: Progressing Goal: Verbalization of understanding the information provided will improve Outcome: Progressing   Problem: Activity: Goal: Sleeping patterns will improve Outcome: Progressing
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
## 2017-11-17 NOTE — Progress Notes (Signed)
[facility] MD Progress Note  11/17/2017 12:19 PM Christie Hart  MRN:  [id number] Subjective: Patient is seen and examined.  Patient is a 22 year old female with a past psychiatric history significant for major depression as well as generalized anxiety.  She is seen in follow-up.  Last night she had a little difficulty.  She stated that the boyfriend came to visit, and he basically said he is unable to stay in the home with her mother, and that she would have to come with him and basically endure homelessness if she decided not to go home.  She was upset by this, but has thought about it.  She stated that she believes she needs to stay with her mother at this time.  She did take the Remeron last night, and she did sleep better.  She has been eating better.  She stated her mood was slowly improving.  She stated she knew that breaking up with the boyfriend would be difficult, but she understood she needed to do what was best for her.  The plan is for a family meeting this afternoon with the mother.  She denied any side effects to her current medications.  She denied any current suicidal ideation. Principal Problem: <principal problem not specified> Diagnosis:   Patient Active Problem List   Diagnosis Date Noted  . Major depressive disorder, recurrent severe without psychotic features (HCC) [F33.2] 11/14/2017   Total Time spent with patient: 15 minutes  Past Psychiatric History: See admission H&P  Past Medical History:  Past Medical History:  Diagnosis Date  . ADHD (attention deficit hyperactivity disorder)   . Bipolar affective disorder (HCC)   . Depression   . Sleep difficulties    History reviewed. No pertinent surgical history. Family History: History reviewed. No pertinent family history. Family Psychiatric  History: See admission H&P Social History:  Social History   Substance and Sexual Activity  Alcohol Use No     Social History   Substance and Sexual Activity  Drug Use No     Social History   Socioeconomic History  . Marital status: Single    Spouse name: Not on file  . Number of children: Not on file  . Years of education: Not on file  . Highest education level: Not on file  Occupational History  . Not on file  Social Needs  . Financial resource strain: Not on file  . Food insecurity:    Worry: Not on file    Inability: Not on file  . Transportation needs:    Medical: Not on file    Non-medical: Not on file  Tobacco Use  . Smoking status: Never Smoker  . Smokeless tobacco: Never Used  Substance and Sexual Activity  . Alcohol use: No  . Drug use: No  . Sexual activity: Not on file  Lifestyle  . Physical activity:    Days per week: Not on file    Minutes per session: Not on file  . Stress: Not on file  Relationships  . Social connections:    Talks on phone: Not on file    Gets together: Not on file    Attends religious service: Not on file    Active member of club or organization: Not on file    Attends meetings of clubs or organizations: Not on file    Relationship status: Not on file  Other Topics Concern  . Not on file  Social History Narrative  . Not on file   Additional Social  History:                         Sleep: Fair  Appetite:  Fair  Current Medications: Current Facility-Administered Medications  Medication Dose Route Frequency Provider Last Rate Last Dose  . acetaminophen (TYLENOL) tablet 650 mg  650 mg Oral Q4H PRN Charm Rings, NP      . alum & mag hydroxide-simeth (MAALOX/MYLANTA) 200-200-20 MG/5ML suspension 30 mL  30 mL Oral Q6H PRN Charm Rings, NP      . Influenza vac split quadrivalent PF (FLUARIX) injection 0.5 mL  0.5 mL Intramuscular Tomorrow-1000 Cobos, Fernando A, MD      . LORazepam (ATIVAN) tablet 0.5 mg  0.5 mg Oral Q6H PRN Cobos, Rockey Situ, MD   0.5 mg at 11/16/17 2107  . magnesium hydroxide (MILK OF MAGNESIA) suspension 30 mL  30 mL Oral Daily PRN Charm Rings, NP      .  mirtazapine (REMERON) tablet 7.5 mg  7.5 mg Oral QHS Cobos, Rockey Situ, MD   7.5 mg at 11/16/17 2107  . ondansetron (ZOFRAN) tablet 4 mg  4 mg Oral Q8H PRN Charm Rings, NP        Lab Results:  Results for orders placed or performed during the hospital encounter of 11/14/17 (from the past 48 hour(s))  TSH     Status: None   Collection Time: 11/16/17  6:17 AM  Result Value Ref Range   TSH 0.751 0.350 - 4.500 uIU/mL    Comment: Performed by a 3rd Generation assay with a functional sensitivity of <=0.01 uIU/mL. Performed at [facility], [address] [place], [place] [address]     Blood Alcohol level:  Lab Results  Component Value Date   ETH <10 11/14/2017    Metabolic Disorder Labs: No results found for: HGBA1C, MPG No results found for: PROLACTIN No results found for: CHOL, TRIG, HDL, CHOLHDL, VLDL, LDLCALC  Physical Findings: AIMS: Facial and Oral Movements Muscles of Facial Expression: None, normal Lips and Perioral Area: None, normal Jaw: None, normal Tongue: None, normal,Extremity Movements Upper (arms, wrists, hands, fingers): None, normal Lower (legs, knees, ankles, toes): None, normal, Trunk Movements Neck, shoulders, hips: None, normal, Overall Severity Severity of abnormal movements (highest score from questions above): None, normal Incapacitation due to abnormal movements: None, normal Patient's awareness of abnormal movements (rate only patient's report): No Awareness, Dental Status Current problems with teeth and/or dentures?: No Does patient usually wear dentures?: No  CIWA:    COWS:     Musculoskeletal: Strength & Muscle Tone: within normal limits Gait & Station: normal Patient leans: N/A  Psychiatric Specialty Exam: Physical Exam  Nursing note and vitals reviewed. Constitutional: She is oriented to person, place, and time. She appears well-developed and well-nourished.  HENT:  Head: Normocephalic and atraumatic.   Respiratory: Effort normal.  Neurological: She is alert and oriented to person, place, and time.    ROS  Blood pressure 104/69, pulse (!) 105, temperature 98.6 F (37 C), temperature source Oral, resp. rate 16, height 5\' 8"  (1.727 m), weight 58.5 kg.Body mass index is 19.61 kg/m.  General Appearance: Casual  Eye Contact:  Fair  Speech:  Normal Rate  Volume:  Decreased  Mood:  Anxious and Depressed  Affect:  Congruent  Thought Process:  Coherent and Descriptions of Associations: Intact  Orientation:  Full (Time, Place, and Person)  Thought Content:  Logical  Suicidal Thoughts:  No  Homicidal Thoughts:  No  Memory:  Immediate;   Fair Recent;   Fair Remote;   Fair  Judgement:  Intact  Insight:  Fair  Psychomotor Activity:  Psychomotor Retardation  Concentration:  Concentration: Fair and Attention Span: Fair  Recall:  [employer] of Knowledge:  Fair  Language:  Good  Akathisia:  Negative  Handed:  Right  AIMS (if indicated):     Assets:  Desire for Improvement Housing Physical Health Resilience Social Support  ADL's:  Intact  Cognition:  WNL  Sleep:  Number of Hours: 6.25     Treatment Plan Summary: Daily contact with patient to assess and evaluate symptoms and progress in treatment, Medication management and Plan : Patient is seen and examined.  Patient is a 22 year old female with the above-stated past psychiatric history who is seen in follow-up.  #1 depression/anxiety-patient did take the mirtazapine last night.  She slept better, and has denied any side effects to that.  We will continue her dosage at 7.5 mg p.o. nightly.  She also has available to her the lorazepam for anxiety and that will be continued.  #2-relationship issues-it sounds like at this point that the patient has decided to stay in the home with her mother.  She understands this may be difficult with the boyfriend being homeless and leaving the home.  There is a family meeting about this this afternoon with  social work and the patient's mother.  #3-disposition planning-the patient is already asking for discharge, and she is scheduled to go back to work on Friday.  I told the patient that if she continued to improve and the family meeting went well we might consider discharge as early as tomorrow.  I stated that we would have to see continued progress to get to that point.  Antonieta Pert, MD 11/17/2017, 12:19 PM

## 2017-11-17 NOTE — Therapy (Signed)
Occupational Therapy Group Note  Date:  11/17/2017 Time:  2:48 PM  Group Topic/Focus:  Self Esteem Action Plan:   The focus of this group is to help patients create a plan to continue to build self-esteem after discharge.  Participation Level:  Minimal  Participation Quality:  Appropriate  Affect:  Depressed and Flat  Cognitive:  Appropriate  Insight: Improving  Engagement in Group:  Engaged  Modes of Intervention:  Activity, Discussion, Education and Socialization  Additional Comments:    S: " My self esteem is a little low"   O:Education given on self esteem, its definition, and how it becomes negative vs positive. Self esteem education given on its relation to MH. Pt encouraged to contribute in discussion and brainstorm. Self esteem activity completed where pt is to name a positive word for each letter of the alphabet (A-Z). Positive affirmations worksheet given at end of session for pt to practice and continue building this skill.   A: Pt presents to group with flat and depressed affect, engaged and participatory throughout session with redirection. Pt completed A-Z activity with min VC's. Pt not willing to share at end of session, very guarded and timid. Accepted positive affirmations at end of session.   P: Handouts given to facilitate carryover into community.  [name], MSOT, OTR/L  [name] 11/17/2017, 2:48 PM

## 2017-11-17 NOTE — Progress Notes (Signed)
Recreation Therapy Notes  Date: 9.11.19 Time: 0930 Location: [address] Dayroom  Group Topic: Stress Management  Goal Area(s) Addresses:  Patient will verbalize importance of using healthy stress management.  Patient will identify positive emotions associated with healthy stress management.   Intervention: Stress Management  Activity :  Guided Imagery.  LRT introduced the stress management technique of guided imagery.  LRT read a script for patients to envision seeing a starry sky at night.  Patients were to follow along as script was read to engage in activity.  Education:  Stress Management, Discharge Planning.   Education Outcome: Acknowledges edcuation/In group clarification offered/Needs additional education  Clinical Observations/Feedback: Pt did not attend group.    Zaynab Chipman, LRT/CTRS         Avrum Kimball A 11/17/2017 12:09 PM 

## 2017-11-17 NOTE — [facility] Group Notes (Signed)
[facility] Group Therapy 11/17/2017 1:15pm  Type of Therapy: Mental Health Association Presentation  Participation Level: Invited. Chose to remain in bed.   Rona Ravens, LCSW 11/17/2017 9:27 AM

## 2017-11-18 MED ORDER — MIRTAZAPINE 7.5 MG PO TABS: 7.5000 mg | ORAL_TABLET | Freq: Every day | ORAL | 0 refills | Status: DC

## 2017-11-18 NOTE — Progress Notes (Addendum)
  [facility] :  Will you be returning to the same living situation after discharge:  Yes,  with mom At discharge, do you have transportation home?: Yes,  mom Do you have the ability to pay for your medications: No. [facility]  Release of information consent forms completed and in the chart;  Patient's signature needed at discharge.  Patient to Follow up at: Follow-up Information    [facility]. Go on 11/23/2017.   Specialty:  Behavioral Health Why:  Please attend your appt on Tuesday, 11/23/17, at 8:00am. Contact information: [address] [place] [id number] [phone]           Next level of care provider [facility] Link:no  Safety Planning and Suicide Prevention discussed: Yes,  with mother  Have you used any form of tobacco in the last 30 days? (Cigarettes, Smokeless Tobacco, Cigars, and/or Pipes): No  Has patient been referred to the Quitline?: N/A patient is not a smoker  Patient has been referred for addiction treatment: N/A  Lorri FrederickWierda, Danny Yackley Jon, LCSW 11/18/2017, 11:04 AM
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
## 2017-11-18 NOTE — [facility] Suicide Risk Assessment (Signed)
[facility]   Principal Problem: <principal problem not specified> Discharge Diagnoses:  Patient Active Problem List   Diagnosis Date Noted  . Major depressive disorder, recurrent severe without psychotic features (HCC) [F33.2] 11/14/2017    Total Time spent with patient: 20 minutes  Musculoskeletal: Strength & Muscle Tone: within normal limits Gait & Station: normal Patient leans: N/A  Psychiatric Specialty Exam: Review of Systems  All other systems reviewed and are negative.   Blood pressure 109/67, pulse (!) 107, temperature 98.7 F (37.1 C), temperature source Oral, resp. rate 16, height 5\' 8"  (1.727 m), weight 58.5 kg.Body mass index is 19.61 kg/m.  General Appearance: Casual  Eye Contact::  Fair  Speech:  Normal Rate409  Volume:  Normal  Mood:  Anxious  Affect:  Congruent  Thought Process:  Coherent and Descriptions of Associations: Intact  Orientation:  Full (Time, Place, and Person)  Thought Content:  Logical  Suicidal Thoughts:  No  Homicidal Thoughts:  No  Memory:  Immediate;   Fair Recent;   Fair Remote;   Fair  Judgement:  Fair  Insight:  Fair  Psychomotor Activity:  Normal  Concentration:  Fair  Recall:  [employer]  Fund of Knowledge:Fair  Language: Fair  Akathisia:  Negative  Handed:  Right  AIMS (if indicated):     Assets:  Communication Skills Desire for Improvement Financial Resources/Insurance Housing Physical Health Resilience Social Support  Sleep:  Number of Hours: 6.25  Cognition: WNL  ADL's:  Intact   Mental Status Per Nursing Assessment::   On Admission:  Suicidal ideation indicated by patient, Suicidal ideation indicated by others, Intention to act on suicide plan  Demographic Factors:  Adolescent or young adult, Caucasian and Low socioeconomic status  Loss Factors: NA  Historical Factors: Impulsivity  Risk Reduction Factors:   Sense of responsibility to family, Employed, Living with another person,  especially a relative and Positive social support  Continued Clinical Symptoms:  Depression:   Impulsivity  Cognitive Features That Contribute To Risk:  None    Suicide Risk:  Minimal: No identifiable suicidal ideation.  Patients presenting with no risk factors but with morbid ruminations; may be classified as minimal risk based on the severity of the depressive symptoms  Follow-up Information    [facility]. Go on 11/23/2017.   Specialty:  Behavioral Health Why:  Please attend your appt on Tuesday, 11/23/17, at 8:00am. Contact information: [address] [place] [address] [phone]           Plan Of Care/Follow-up recommendations:  Activity:  ad lib  Antonieta PertGreg Lawson Ysabela Keisler, MD 11/18/2017, 11:01 AM

## 2017-11-18 NOTE — Progress Notes (Addendum)
D: Pt A & O X 4. Denies SI, HI, AVH and pain at this time. Presents with sullen affect and depressed mood. Rates her depression, anxiety and hopelessness al 0/10. Reports she's eating and sleeping well with high energy and good concentration level. Pt's goal for today is "how to cope with things, so I can leave". Pt d/c home as ordered. Picked up in lobby by her boyfriend. A: D/C instructions reviewed with pt including prescriptions, medication samples and follow up appointment; compliance encouraged. All belongings from locker # 36 given to pt at time of departure. Scheduled and PRN medications given with verbal education and effects monitored. Safety checks maintained without incident till time of d/c.  R: Pt receptive to care. Compliant with medications when offered. Denies adverse drug reactions when assessed. Verbalized understanding related to d/c instructions. Signed belonging sheet in agreement with items received from locker. Ambulatory with a steady gait. Appears to be in no physical distress at time of departure.

## 2017-11-18 NOTE — [facility] Group Notes (Signed)
[facility] LCSW Group Therapy Note  Date/Time: 11/18/17, 1315  Type of Therapy/Topic:  Group Therapy:  Balance in Life  Participation Level:  minimal  Description of Group:    This group will address the concept of balance and how it feels and looks when one is unbalanced. Patients will be encouraged to process areas in their lives that are out of balance, and identify reasons for remaining unbalanced. Facilitators will guide patients utilizing problem- solving interventions to address and correct the stressor making their life unbalanced. Understanding and applying boundaries will be explored and addressed for obtaining  and maintaining a balanced life. Patients will be encouraged to explore ways to assertively make their unbalanced needs known to significant others in their lives, using other group members and facilitator for support and feedback.  Therapeutic Goals: 1. Patient will identify two or more emotions or situations they have that consume much of in their lives. 2. Patient will identify signs/triggers that life has become out of balance:  3. Patient will identify two ways to set boundaries in order to achieve balance in their lives:  4. Patient will demonstrate ability to communicate their needs through discussion and/or role plays  Summary of Patient Progress:Pt shared that unhealthy relationship choices are consuming her life.  Pt declined to comment on her situation, but was attentive to the group discussion.          Therapeutic Modalities:   Cognitive Behavioral Therapy Solution-Focused Therapy Assertiveness Training  Daleen SquibbGreg Temesha Queener, [place]
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
## 2017-12-05 ENCOUNTER — Emergency Department (HOSPITAL_COMMUNITY)
Admission: EM | Admit: 2017-12-05 | Discharge: 2017-12-05 | Disposition: A | Discharge status: Home / Self Care | Payer: Self-pay | Attending: Emergency Medicine | Admitting: Emergency Medicine

## 2017-12-05 ENCOUNTER — Other Ambulatory Visit: Payer: Self-pay

## 2017-12-05 ENCOUNTER — Encounter (HOSPITAL_COMMUNITY): Payer: Self-pay

## 2017-12-05 DIAGNOSIS — F410 Panic disorder [episodic paroxysmal anxiety] without agoraphobia: Secondary | ICD-10-CM | POA: Insufficient documentation

## 2017-12-05 DIAGNOSIS — F319 Bipolar disorder, unspecified: Secondary | ICD-10-CM | POA: Insufficient documentation

## 2017-12-05 DIAGNOSIS — F909 Attention-deficit hyperactivity disorder, unspecified type: Secondary | ICD-10-CM | POA: Insufficient documentation

## 2017-12-05 DIAGNOSIS — Z79899 Other long term (current) drug therapy: Secondary | ICD-10-CM | POA: Insufficient documentation

## 2017-12-05 MED ORDER — HYDROXYZINE HCL 25 MG PO TABS: 25.0000 mg | ORAL_TABLET | Freq: Four times a day (QID) | ORAL | 0 refills | Status: DC

## 2017-12-05 NOTE — ED Provider Notes (Signed)
[facility]-EMERGENCY DEPT Provider Note   CSN: [id number] Arrival date & time: 12/05/17  1250     History   Chief Complaint Chief Complaint  Patient presents with  . Panic Attack    HPI Christie Hart is a 22 y.o. female.  Patient with history of anxiety presents after an anxiety attack today.  Patient states that she was at work eating lunch when her chest began to hurt.  She then started breathing very heavily and had tingling in her face and hands.  She had an episode of nausea and vomiting.  Because of the symptoms she came to the emergency department.  Currently her symptoms are resolved.  Symptoms today similar to previous anxiety attacks.  She denies any other problems with her heart or abnormal heartbeats.  She currently denies any suicidal ideation or homicidal ideation.  No fevers, cough.       Past Medical History:  Diagnosis Date  . ADHD (attention deficit hyperactivity disorder)   . Bipolar affective disorder (HCC)   . Depression   . Sleep difficulties     Patient Active Problem List   Diagnosis Date Noted  . Major depressive disorder, recurrent severe without psychotic features (HCC) 11/14/2017    History reviewed. No pertinent surgical history.   OB History   None      Home Medications    Prior to Admission medications   Medication Sig Start Date End Date Taking? Authorizing Provider  albuterol (PROVENTIL HFA;VENTOLIN HFA) 108 (90 BASE) MCG/ACT inhaler Inhale 1-2 puffs into the lungs every 6 (six) hours as needed for wheezing. Patient not taking: Reported on 11/14/2017 06/08/12   Nelva Nay, MD  mirtazapine (REMERON) 7.5 MG tablet Take 1 tablet (7.5 mg total) by mouth at bedtime. For sleep and mood control 11/18/17   Money, Gerlene Burdock, FNP    Family History Family History  Problem Relation Age of Onset  . Diabetes Mother   . Hypertension Mother     Social History Social History   Tobacco Use  . Smoking status:  Never Smoker  . Smokeless tobacco: Never Used  Substance Use Topics  . Alcohol use: No  . Drug use: No     Allergies   Patient has no known allergies.   Review of Systems Review of Systems  Constitutional: Negative for fever.  HENT: Negative for rhinorrhea and sore throat.   Eyes: Negative for redness.  Respiratory: Positive for shortness of breath. Negative for cough.   Cardiovascular: Positive for chest pain.  Gastrointestinal: Positive for nausea and vomiting. Negative for abdominal pain and diarrhea.  Genitourinary: Negative for dysuria.  Musculoskeletal: Negative for myalgias.  Skin: Negative for rash.  Neurological: Negative for headaches.  Psychiatric/Behavioral: Negative for suicidal ideas. The patient is nervous/anxious.      Physical Exam Updated Vital Signs BP 112/67   Pulse 63   Temp 98.2 F (36.8 C) (Oral)   Resp 14   Ht 5\' 8"  (1.727 m)   Wt 53.1 kg   LMP 11/04/2017 (Approximate)   SpO2 100%   BMI 17.79 kg/m   Physical Exam  Constitutional: She appears well-developed and well-nourished.  HENT:  Head: Normocephalic and atraumatic.  Eyes: Conjunctivae are normal. Right eye exhibits no discharge. Left eye exhibits no discharge.  Neck: Normal range of motion. Neck supple.  Cardiovascular: Normal rate, regular rhythm and normal heart sounds.  Pulmonary/Chest: Effort normal and breath sounds normal.  Abdominal: Soft. There is no tenderness.  Neurological: She  is alert.  Skin: Skin is warm and dry.  Psychiatric: She has a normal mood and affect. She expresses no homicidal and no suicidal ideation.  Nursing note and vitals reviewed.    ED Treatments / Results  Labs (all labs ordered are listed, but only abnormal results are displayed) Labs Reviewed - No data to display  ED ECG REPORT   Date: 12/05/2017  Rate: 68  Rhythm: normal sinus rhythm  QRS Axis: normal  Intervals: normal  ST/T Wave abnormalities: early repolarization  Conduction  Disutrbances:none  Narrative Interpretation:   Old EKG Reviewed: none available  I have personally reviewed the EKG tracing and agree with the computerized printout as noted.  Radiology No results found.  Procedures Procedures (including critical care time)  Medications Ordered in ED Medications - No data to display   Initial Impression / Assessment and Plan / ED Course  I have reviewed the triage vital signs and the nursing notes.  Pertinent labs & imaging results that were available during my care of the patient were reviewed by me and considered in my medical decision making (see chart for details).     Patient seen and examined.  Patient with clinical history suggestive of a panic attack today.  Her symptoms are currently resolved.  Will check EKG given reported chest pain and lightheadedness with the incident.  If this is negative, anticipate discharged home.  Will give hydroxyzine to use while at home.  Encourage follow-up with primary care or counselor.    Patient counseled on proper use of hydroxyzine.  They were told not to drive any vehicle, or do any dangerous activities while taking this medication.  Patient verbalized understanding.  Vital signs reviewed and are as follows: BP 112/67   Pulse 63   Temp 98.2 F (36.8 C) (Oral)   Resp 14   Ht 5\' 8"  (1.727 m)   Wt 53.1 kg   LMP 11/04/2017 (Approximate)   SpO2 100%   BMI 17.79 kg/m   3:54 PM EKG reviewed.   Final Clinical Impressions(s) / ED Diagnoses   Final diagnoses:  Panic attack   Patient with signs symptoms of panic attack today, now resolved.  She had chest pain during the episode and this is also resolved.  EKG without concerning findings.  No arrhythmias.  No signs of QT prolongation, Brugada syndrome, reentrant tachycardia, LVH, or other arrhythmogenic problems.  Comfortable with discharge home at this time.  Hydroxyzine as needed.  Strongly encourage PCP/counselor follow-up.  ED Discharge Orders           Ordered    hydrOXYzine (ATARAX/VISTARIL) 25 MG tablet  Every 6 hours     12/05/17 1554           Renne Crigler, PA-C 12/05/17 1556    Lorre Nick, MD 12/06/17 [phone]

## 2017-12-05 NOTE — Discharge Instructions (Signed)
Please read and follow all provided instructions.  Your diagnoses today include:  1. Panic attack     Tests performed today include:  EKG - looked okay  Vital signs. See below for your results today.   Medications prescribed:   Hydroxyzine - antihistamine  You can find this medication over-the-counter.   This medication will make you drowsy. DO NOT drive or perform any activities that require you to be awake and alert if taking this.  Take any prescribed medications only as directed.  Home care instructions:  Follow any educational materials contained in this packet.  BE VERY CAREFUL not to take multiple medicines containing Tylenol (also called acetaminophen). Doing so can lead to an overdose which can damage your liver and cause liver failure and possibly death.   Follow-up instructions: Please follow-up with your primary care provider in the next 3 days for further evaluation of your symptoms.   Return instructions:   Please return to the Emergency Department if you experience worsening symptoms.   Please return if you have any other emergent concerns.  Additional Information:  Your vital signs today were: BP 112/67    Pulse 63    Temp 98.2 F (36.8 C) (Oral)    Resp 14    Ht 5\' 8"  (1.727 m)    Wt 53.1 kg    LMP 11/04/2017 (Approximate)    SpO2 100%    BMI 17.79 kg/m  If your blood pressure (BP) was elevated above 135/85 this visit, please have this repeated by your doctor within one month. --------------

## 2017-12-05 NOTE — ED Triage Notes (Signed)
Per EMS- patient was at work and had a panic attack. Patient reported that she was seen in the ED 3 weeks where she was admitted to [facility].

## 2017-12-05 NOTE — ED Notes (Signed)
Pt denies any pain at this time. Pt SO is at bedside. Pt reports that she has some chest pain central. Pt reports that she has HX of anxiety . Pt is calm.

## 2018-09-11 ENCOUNTER — Inpatient Hospital Stay (HOSPITAL_COMMUNITY): Payer: Medicaid Other

## 2018-09-11 ENCOUNTER — Encounter (HOSPITAL_COMMUNITY): Payer: Self-pay | Admitting: *Deleted

## 2018-09-11 ENCOUNTER — Inpatient Hospital Stay (HOSPITAL_COMMUNITY)
Admission: AD | Admit: 2018-09-11 | Discharge: 2018-09-11 | Disposition: A | Discharge status: Home / Self Care | Payer: Medicaid Other | Attending: Family Medicine | Admitting: Family Medicine

## 2018-09-11 ENCOUNTER — Other Ambulatory Visit: Payer: Self-pay

## 2018-09-11 DIAGNOSIS — R1084 Generalized abdominal pain: Secondary | ICD-10-CM

## 2018-09-11 DIAGNOSIS — R109 Unspecified abdominal pain: Secondary | ICD-10-CM

## 2018-09-11 DIAGNOSIS — R42 Dizziness and giddiness: Secondary | ICD-10-CM

## 2018-09-11 DIAGNOSIS — B9689 Other specified bacterial agents as the cause of diseases classified elsewhere: Secondary | ICD-10-CM | POA: Diagnosis not present

## 2018-09-11 DIAGNOSIS — Z3A01 Less than 8 weeks gestation of pregnancy: Secondary | ICD-10-CM | POA: Diagnosis not present

## 2018-09-11 DIAGNOSIS — O26891 Other specified pregnancy related conditions, first trimester: Secondary | ICD-10-CM

## 2018-09-11 DIAGNOSIS — R110 Nausea: Secondary | ICD-10-CM

## 2018-09-11 DIAGNOSIS — R1030 Lower abdominal pain, unspecified: Secondary | ICD-10-CM | POA: Insufficient documentation

## 2018-09-11 DIAGNOSIS — Z3491 Encounter for supervision of normal pregnancy, unspecified, first trimester: Secondary | ICD-10-CM

## 2018-09-11 DIAGNOSIS — N760 Acute vaginitis: Secondary | ICD-10-CM

## 2018-09-11 DIAGNOSIS — O23591 Infection of other part of genital tract in pregnancy, first trimester: Secondary | ICD-10-CM | POA: Insufficient documentation

## 2018-09-11 LAB — CBC WITH DIFFERENTIAL/PLATELET
Abs Immature Granulocytes: 0.08 10*3/uL — ABNORMAL HIGH (ref 0.00–0.07)
Basophils Absolute: 0 10*3/uL (ref 0.0–0.1)
Basophils Relative: 0 %
Eosinophils Absolute: 0 10*3/uL (ref 0.0–0.5)
Eosinophils Relative: 0 %
HCT: 40.7 % (ref 36.0–46.0)
Hemoglobin: 13.9 g/dL (ref 12.0–15.0)
Immature Granulocytes: 1 %
Lymphocytes Relative: 20 %
Lymphs Abs: 2.1 10*3/uL (ref 0.7–4.0)
MCH: 30.8 pg (ref 26.0–34.0)
MCHC: 34.2 g/dL (ref 30.0–36.0)
MCV: 90.2 fL (ref 80.0–100.0)
Monocytes Absolute: 1 10*3/uL (ref 0.1–1.0)
Monocytes Relative: 10 %
Neutro Abs: 7 10*3/uL (ref 1.7–7.7)
Neutrophils Relative %: 69 %
Platelets: 239 10*3/uL (ref 150–400)
RBC: 4.51 MIL/uL (ref 3.87–5.11)
RDW: 12.5 % (ref 11.5–15.5)
WBC: 10.2 10*3/uL (ref 4.0–10.5)
nRBC: 0 % (ref 0.0–0.2)

## 2018-09-11 LAB — COMPREHENSIVE METABOLIC PANEL
ALT: 15 U/L (ref 0–44)
AST: 18 U/L (ref 15–41)
Albumin: 4.2 g/dL (ref 3.5–5.0)
Alkaline Phosphatase: 52 U/L (ref 38–126)
Anion gap: 8 (ref 5–15)
BUN: 5 mg/dL — ABNORMAL LOW (ref 6–20)
CO2: 21 mmol/L — ABNORMAL LOW (ref 22–32)
Calcium: 9.6 mg/dL (ref 8.9–10.3)
Chloride: 108 mmol/L (ref 98–111)
Creatinine, Ser: 0.83 mg/dL (ref 0.44–1.00)
GFR calc Af Amer: >60 mL/min (ref >60)
GFR calc non Af Amer: >60 mL/min (ref >60)
Glucose, Bld: 97 mg/dL (ref 70–99)
Potassium: 4.1 mmol/L (ref 3.5–5.1)
Sodium: 137 mmol/L (ref 135–145)
Total Bilirubin: 0.7 mg/dL (ref 0.3–1.2)
Total Protein: 7 g/dL (ref 6.5–8.1)

## 2018-09-11 LAB — WET PREP, GENITAL
Sperm: NONE SEEN
Trich, Wet Prep: NONE SEEN
Yeast Wet Prep HPF POC: NONE SEEN

## 2018-09-11 LAB — URINALYSIS, ROUTINE W REFLEX MICROSCOPIC
Bilirubin Urine: NEGATIVE
Glucose, UA: NEGATIVE mg/dL
Hgb urine dipstick: NEGATIVE
Ketones, ur: 20 mg/dL — AB
Nitrite: NEGATIVE
Protein, ur: 30 mg/dL — AB
Specific Gravity, Urine: 1.023 (ref 1.005–1.030)
pH: 5 (ref 5.0–8.0)

## 2018-09-11 LAB — HCG, QUANTITATIVE, PREGNANCY: hCG, Beta Chain, Quant, S: 17799 m[IU]/mL — ABNORMAL HIGH (ref <5)

## 2018-09-11 LAB — I-STAT BETA HCG BLOOD, ED (MC, WL, AP ONLY): I-stat hCG, quantitative: >2000 m[IU]/mL — ABNORMAL HIGH (ref <5)

## 2018-09-11 LAB — LIPASE, BLOOD: Lipase: 27 U/L (ref 11–51)

## 2018-09-11 MED ORDER — METRONIDAZOLE 500 MG PO TABS: 500.0000 mg | ORAL_TABLET | Freq: Two times a day (BID) | ORAL | 0 refills | Status: DC

## 2018-09-11 MED ORDER — CONCEPT OB 130-92.4-1 MG PO CAPS: 1.0000 | ORAL_CAPSULE | Freq: Every day | ORAL | 12 refills | Status: DC

## 2018-09-11 MED ORDER — PROMETHAZINE HCL 25 MG PO TABS: 25.0000 mg | ORAL_TABLET | Freq: Four times a day (QID) | ORAL | 2 refills | Status: DC | PRN

## 2018-09-11 MED ORDER — SODIUM CHLORIDE 0.9 % IV BOLUS: 1000.0000 mL | Freq: Once | INTRAVENOUS | Status: DC

## 2018-09-11 NOTE — MAU Note (Signed)
Urine in lab Not enough for culture tube 
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
## 2018-09-11 NOTE — MAU Note (Signed)
Pt reports abd pain earlier this morning before coming to the ED.  Pt now denies any pain, or vag. Bleeding.  Pt denies any dc or painful urination.

## 2018-09-11 NOTE — Discharge Instructions (Signed)
[place] for [employer] at [facility]       Phone: [phone]  [place] for [employer] at [place]   Phone: [place] for [employer] at [place]  Phone: [place] for [employer] at [facility]  Phone: [place] for [employer] at [place]  Phone: [place] for [place] at [facility]   Phone: [facility]       Phone: [phone]  [facility]    Phone: [phone]   [facility]    Phone: [phone]  [facility]    Phone: [place]    Phone: [phone]  [place] [facility]       Phone: [phone]   [place] [facility]  Phone: [place]    Phone: [phone]  [facility]   Phone: [phone]  [facility]      Phone: [phone]  [facility]    Phone: [phone]    Abdominal Pain During Pregnancy  Abdominal pain is common during pregnancy, and has many possible causes. Some causes are more serious than others, and sometimes the cause is not known. Abdominal pain can be a sign that labor is starting. It can also be caused by normal growth and stretching of muscles and ligaments during pregnancy. Always tell your health care provider if you have any abdominal pain. Follow these instructions at home:  Do not have sex or put anything in your vagina until your pain goes away completely.  Get plenty of rest until your pain improves.  Drink enough fluid to keep your urine pale yellow.  Take over-the-counter and prescription medicines only as told by your health care provider.  Keep all follow-up visits as told by your health care provider. This is  important. Contact a health care provider if:  Your pain continues or gets worse after resting.  You have lower abdominal pain that: ? Comes and goes at regular intervals. ? Spreads to your back. ? Is similar to menstrual cramps.  You have pain or burning when you urinate. Get help right away if:  You have a fever or chills.  You have vaginal bleeding.  You are leaking fluid from your vagina.  You are passing tissue from your vagina.  You have vomiting or diarrhea that lasts for more than 24 hours.  Your baby is moving less than usual.  You feel very weak or faint.  You have shortness of breath.  You develop severe pain in your upper abdomen. Summary  Abdominal pain is common during pregnancy, and has many possible causes.  If you experience abdominal pain during pregnancy, tell your health care provider right away.  Follow your health care provider's home care instructions and keep all follow-up visits as directed. This information is not intended to replace advice given to you by your health care provider. Make sure you discuss any questions you have with your health care provider. Document Released: 02/23/2005 Document Revised: 06/13/2018 Document Reviewed: 05/28/2016 Elsevier Patient Education  2020 [employer].

## 2018-09-11 NOTE — ED Notes (Signed)
Report given to rn at [facility]

## 2018-09-11 NOTE — ED Provider Notes (Signed)
[facility]   CSN: [id number] Arrival date & time: 09/11/18  0502    History   Chief Complaint Chief Complaint  Patient presents with  . Abdominal Pain    HPI Christie Hart is a 23 y.o. female G0P0 with a hx of ADHD, depression presents to the Emergency Department complaining of gradual, persistent, progressively worsening generalized abd pain onset this afternoon. Associated symptoms include nausea.  Pt reports at the time she felt lightheaded, but did not pass out.  This sensation has improved.  Pt reports LMP May 27th.  Pt reports she is 1 week late.  Pt reports she is sexually active with 1 female partner without birth control.  Nothing makes her symptoms better. Pt reports this all started this afternoon when she began to worry that she was pregnant.  Pt denies taking a pregnancy test.  Pt denies smoking, EtOH and drug usage.  Pt also denies fever, chills, headache, neck pain, chest pain, diarrhea, weakness, syncope, dysuria, vaginal discharge, vaginal bleeding.  Pt denies hx of abd surgeries.       The history is provided by the patient and medical records. No language interpreter was used.    Past Medical History:  Diagnosis Date  . ADHD (attention deficit hyperactivity disorder)   . Bipolar affective disorder (HCC)   . Depression   . Sleep difficulties     Patient Active Problem List   Diagnosis Date Noted  . Major depressive disorder, recurrent severe without psychotic features (HCC) 11/14/2017    History reviewed. No pertinent surgical history.   OB History   No obstetric history on file.      Home Medications    Prior to Admission medications   Medication Sig Start Date End Date Taking? Authorizing Provider  hydrOXYzine (ATARAX/VISTARIL) 25 MG tablet Take 1 tablet (25 mg total) by mouth every 6 (six) hours. 12/05/17   Renne CriglerGeiple, Joshua, PA-C    Family History Family History  Problem Relation Age of  Onset  . Diabetes Mother   . Hypertension Mother     Social History Social History   Tobacco Use  . Smoking status: Never Smoker  . Smokeless tobacco: Never Used  Substance Use Topics  . Alcohol use: No  . Drug use: No     Allergies   Patient has no known allergies.   Review of Systems Review of Systems  Constitutional: Negative for appetite change, diaphoresis, fatigue, fever and unexpected weight change.  HENT: Negative for mouth sores.   Eyes: Negative for visual disturbance.  Respiratory: Negative for cough, chest tightness, shortness of breath and wheezing.   Cardiovascular: Negative for chest pain.  Gastrointestinal: Positive for abdominal pain. Negative for constipation, diarrhea, nausea and vomiting.  Endocrine: Negative for polydipsia, polyphagia and polyuria.  Genitourinary: Positive for menstrual problem ( late). Negative for dysuria, frequency, hematuria and urgency.  Musculoskeletal: Negative for back pain and neck stiffness.  Skin: Negative for rash.  Allergic/Immunologic: Negative for immunocompromised state.  Neurological: Positive for light-headedness. Negative for syncope and headaches.  Hematological: Does not bruise/bleed easily.  Psychiatric/Behavioral: Negative for sleep disturbance. The patient is not nervous/anxious.      Physical Exam Updated Vital Signs BP 95/66   Pulse 81   Temp 98.7 F (37.1 C) (Oral)   Resp 20   Ht 5\' 9"  (1.753 m)   Wt 68 kg   LMP 09/04/2018   SpO2 98%   BMI 22.15 kg/m   Physical Exam  Vitals signs and nursing note reviewed.  Constitutional:      General: She is not in acute distress.    Appearance: She is not diaphoretic.  HENT:     Head: Normocephalic.  Eyes:     General: No scleral icterus.    Conjunctiva/sclera: Conjunctivae normal.  Neck:     Musculoskeletal: Normal range of motion.  Cardiovascular:     Rate and Rhythm: Normal rate and regular rhythm.     Pulses: Normal pulses.          Radial  pulses are 2+ on the right side and 2+ on the left side.  Pulmonary:     Effort: No tachypnea, accessory muscle usage, prolonged expiration, respiratory distress or retractions.     Breath sounds: No stridor.     Comments: Equal chest rise. No increased work of breathing. Abdominal:     General: There is no distension.     Palpations: Abdomen is soft.     Tenderness: There is no abdominal tenderness. There is no guarding or rebound.  Musculoskeletal:     Comments: Moves all extremities equally and without difficulty.  Skin:    General: Skin is warm and dry.     Capillary Refill: Capillary refill takes less than 2 seconds.  Neurological:     Mental Status: She is alert.     GCS: GCS eye subscore is 4. GCS verbal subscore is 5. GCS motor subscore is 6.     Comments: Speech is clear and goal oriented.  Psychiatric:        Mood and Affect: Mood normal.      ED Treatments / Results  Labs (all labs ordered are listed, but only abnormal results are displayed) Labs Reviewed  CBC WITH DIFFERENTIAL/PLATELET - Abnormal; Notable for the following components:      Result Value   Abs Immature Granulocytes 0.08 (*)    All other components within normal limits  COMPREHENSIVE METABOLIC PANEL - Abnormal; Notable for the following components:   CO2 21 (*)    BUN 5 (*)    All other components within normal limits  I-STAT BETA HCG BLOOD, ED ([facility], [facility], [facility] ONLY) - Abnormal; Notable for the following components:   I-stat hCG, quantitative >2,000.0 (*)    All other components within normal limits  LIPASE, BLOOD  URINALYSIS, ROUTINE W REFLEX MICROSCOPIC  HCG, QUANTITATIVE, PREGNANCY    Procedures Procedures (including critical care time)  Medications Ordered in ED Medications  sodium chloride 0.9 % bolus 1,000 mL (has no administration in time range)     Initial Impression / Assessment and Plan / ED Course  I have reviewed the triage vital signs and the nursing notes.  Pertinent labs &  imaging results that were available during my care of the patient were reviewed by me and considered in my medical decision making (see chart for details).  Clinical Course as of Sep 10 617  Wynelle LinkSun Sep 11, 2018  0613 Pt pregnant  I-stat hCG, quantitative(!): >2,000.0 [HM]    Clinical Course User Index [HM] Narmeen Kerper, Boyd KerbsHannah, PA-C        Resents to the emergency department with generalized abdominal pain and lightheadedness.  She is worried that she might be pregnant.  Pregnancy test is positive.  Cytosis and patient is without anemia.  Other labs are pending.  Ultrasound ordered to rule out ectopic pregnancy.  Fluids ordered.  Will be transferred to the MAU for further work-up.  Discussed with Sharen CounterLisa Leftwich-Kirby, CNM  in the MAU who accepts patient for the remainder of her work-up.   Final Clinical Impressions(s) / ED Diagnoses   Final diagnoses:  Generalized abdominal pain  Nausea  Lightheadedness    ED Discharge Orders    None       Loni Muse Gwenlyn Perking 49/82/64 1583    Delora Fuel, MD 09/40[birth year]76 [phone]

## 2018-09-13 LAB — CERVICOVAGINAL ANCILLARY ONLY
Chlamydia: NEGATIVE
Neisseria Gonorrhea: NEGATIVE

## 2018-09-13 LAB — GC/CHLAMYDIA PROBE AMP (~~LOC~~) NOT AT ARMC
Chlamydia: NEGATIVE
Neisseria Gonorrhea: NEGATIVE

## 2018-10-25 LAB — OB RESULTS CONSOLE HIV ANTIBODY (ROUTINE TESTING): HIV: NONREACTIVE

## 2018-10-25 LAB — OB RESULTS CONSOLE RPR: RPR: NONREACTIVE

## 2018-10-25 LAB — OB RESULTS CONSOLE RUBELLA ANTIBODY, IGM: Rubella: IMMUNE

## 2018-10-25 LAB — OB RESULTS CONSOLE GC/CHLAMYDIA
Chlamydia: NEGATIVE
Gonorrhea: NEGATIVE

## 2018-10-25 LAB — OB RESULTS CONSOLE ABO/RH: RH Type: POSITIVE

## 2018-10-25 LAB — OB RESULTS CONSOLE HEPATITIS B SURFACE ANTIGEN: Hepatitis B Surface Ag: NEGATIVE

## 2018-10-25 LAB — OB RESULTS CONSOLE ANTIBODY SCREEN: Antibody Screen: NEGATIVE

## 2019-03-10 NOTE — L&D Delivery Note (Signed)
Pt was admitted in labor. She progressed along a nl labor curve. She pushed for 20 mins. She had a SVD of one live viable infant in the ROA presentation. Nuchal cord x 2. Placenta -S/I. EBL-400 cc Baby to [facility]

## 2019-04-20 LAB — OB RESULTS CONSOLE GBS: GBS: POSITIVE

## 2019-05-09 ENCOUNTER — Other Ambulatory Visit: Payer: Self-pay | Admitting: Obstetrics and Gynecology

## 2019-05-10 ENCOUNTER — Encounter (HOSPITAL_COMMUNITY): Payer: Self-pay | Admitting: *Deleted

## 2019-05-10 ENCOUNTER — Telehealth (HOSPITAL_COMMUNITY): Payer: Self-pay | Admitting: *Deleted

## 2019-05-10 NOTE — Telephone Encounter (Signed)
Preadmission screen  

## 2019-05-11 ENCOUNTER — Telehealth (HOSPITAL_COMMUNITY): Payer: Self-pay | Admitting: *Deleted

## 2019-05-11 ENCOUNTER — Encounter (HOSPITAL_COMMUNITY): Payer: Self-pay | Admitting: *Deleted

## 2019-05-11 NOTE — Telephone Encounter (Signed)
Preadmission screen  

## 2019-05-14 ENCOUNTER — Other Ambulatory Visit: Payer: Self-pay

## 2019-05-14 ENCOUNTER — Encounter (HOSPITAL_COMMUNITY): Payer: Self-pay | Admitting: Obstetrics and Gynecology

## 2019-05-14 ENCOUNTER — Inpatient Hospital Stay (HOSPITAL_COMMUNITY): Payer: Medicaid Other | Admitting: Anesthesiology

## 2019-05-14 ENCOUNTER — Inpatient Hospital Stay (HOSPITAL_COMMUNITY)
Admission: AD | Admit: 2019-05-14 | Discharge: 2019-05-16 | DRG: 807 | Disposition: A | Discharge status: Home / Self Care | Payer: Medicaid Other | Attending: Obstetrics and Gynecology | Admitting: Obstetrics and Gynecology

## 2019-05-14 DIAGNOSIS — Z3A40 40 weeks gestation of pregnancy: Secondary | ICD-10-CM

## 2019-05-14 DIAGNOSIS — Z20822 Contact with and (suspected) exposure to covid-19: Secondary | ICD-10-CM | POA: Diagnosis present

## 2019-05-14 DIAGNOSIS — O99824 Streptococcus B carrier state complicating childbirth: Secondary | ICD-10-CM | POA: Diagnosis present

## 2019-05-14 DIAGNOSIS — O26893 Other specified pregnancy related conditions, third trimester: Secondary | ICD-10-CM | POA: Diagnosis present

## 2019-05-14 DIAGNOSIS — Z3490 Encounter for supervision of normal pregnancy, unspecified, unspecified trimester: Secondary | ICD-10-CM

## 2019-05-14 LAB — CBC
HCT: 41.6 % (ref 36.0–46.0)
Hemoglobin: 14.2 g/dL (ref 12.0–15.0)
MCH: 31.8 pg (ref 26.0–34.0)
MCHC: 34.1 g/dL (ref 30.0–36.0)
MCV: 93.1 fL (ref 80.0–100.0)
Platelets: 210 10*3/uL (ref 150–400)
RBC: 4.47 MIL/uL (ref 3.87–5.11)
RDW: 12.3 % (ref 11.5–15.5)
WBC: 10.4 10*3/uL (ref 4.0–10.5)
nRBC: 0 % (ref 0.0–0.2)

## 2019-05-14 LAB — RESPIRATORY PANEL BY RT PCR (FLU A&B, COVID)
Influenza A by PCR: NEGATIVE
Influenza B by PCR: NEGATIVE
SARS Coronavirus 2 by RT PCR: NEGATIVE

## 2019-05-14 LAB — ABO/RH: ABO/RH(D): O POS

## 2019-05-14 LAB — TYPE AND SCREEN
ABO/RH(D): O POS
Antibody Screen: NEGATIVE

## 2019-05-14 LAB — RPR: RPR Ser Ql: NONREACTIVE

## 2019-05-14 MED ORDER — TETANUS-DIPHTH-ACELL PERTUSSIS 5-2.5-18.5 LF-MCG/0.5 IM SUSP: 0.5000 mL | Freq: Once | INTRAMUSCULAR | Status: DC

## 2019-05-14 MED ORDER — COCONUT OIL OIL: 1.0000 "application " | TOPICAL_OIL | Status: DC | PRN

## 2019-05-14 MED ORDER — OXYCODONE-ACETAMINOPHEN 5-325 MG PO TABS: 2.0000 | ORAL_TABLET | ORAL | Status: DC | PRN

## 2019-05-14 MED ORDER — OXYTOCIN 40 UNITS IN NORMAL SALINE INFUSION - SIMPLE MED
2.5000 [IU]/h | INTRAVENOUS | Status: DC
  Filled 2019-05-14: qty 1000

## 2019-05-14 MED ORDER — ONDANSETRON HCL 4 MG/2ML IJ SOLN: 4.0000 mg | INTRAMUSCULAR | Status: DC | PRN

## 2019-05-14 MED ORDER — FENTANYL-BUPIVACAINE-NACL 0.5-0.125-0.9 MG/250ML-% EP SOLN
12.0000 mL/h | EPIDURAL | Status: DC | PRN
  Filled 2019-05-14: qty 250

## 2019-05-14 MED ORDER — OXYCODONE-ACETAMINOPHEN 5-325 MG PO TABS: 1.0000 | ORAL_TABLET | ORAL | Status: DC | PRN

## 2019-05-14 MED ORDER — FLEET ENEMA 7-19 GM/118ML RE ENEM: 1.0000 | ENEMA | RECTAL | Status: DC | PRN

## 2019-05-14 MED ORDER — OXYTOCIN BOLUS FROM INFUSION: 500.0000 mL | Freq: Once | INTRAVENOUS | Status: DC

## 2019-05-14 MED ORDER — LACTATED RINGERS IV SOLN: INTRAVENOUS | Status: DC

## 2019-05-14 MED ORDER — SIMETHICONE 80 MG PO CHEW: 80.0000 mg | CHEWABLE_TABLET | ORAL | Status: DC | PRN

## 2019-05-14 MED ORDER — PHENYLEPHRINE 40 MCG/ML (10ML) SYRINGE FOR IV PUSH (FOR BLOOD PRESSURE SUPPORT)
80.0000 ug | PREFILLED_SYRINGE | INTRAVENOUS | Status: DC | PRN
  Administered 2019-05-14: 80 ug via INTRAVENOUS
  Filled 2019-05-14: qty 10

## 2019-05-14 MED ORDER — ZOLPIDEM TARTRATE 5 MG PO TABS: 5.0000 mg | ORAL_TABLET | Freq: Every evening | ORAL | Status: DC | PRN

## 2019-05-14 MED ORDER — LIDOCAINE HCL (PF) 1 % IJ SOLN: 30.0000 mL | INTRAMUSCULAR | Status: DC | PRN

## 2019-05-14 MED ORDER — ACETAMINOPHEN 325 MG PO TABS
650.0000 mg | ORAL_TABLET | ORAL | Status: DC | PRN
  Administered 2019-05-14: 650 mg via ORAL
  Filled 2019-05-14 (×2): qty 2

## 2019-05-14 MED ORDER — ACETAMINOPHEN 325 MG PO TABS: 650.0000 mg | ORAL_TABLET | ORAL | Status: DC | PRN

## 2019-05-14 MED ORDER — ONDANSETRON HCL 4 MG/2ML IJ SOLN: 4.0000 mg | Freq: Four times a day (QID) | INTRAMUSCULAR | Status: DC | PRN

## 2019-05-14 MED ORDER — IBUPROFEN 600 MG PO TABS
600.0000 mg | ORAL_TABLET | Freq: Four times a day (QID) | ORAL | Status: DC
  Administered 2019-05-14 – 2019-05-16 (×8): 600 mg via ORAL
  Filled 2019-05-14 (×8): qty 1

## 2019-05-14 MED ORDER — SOD CITRATE-CITRIC ACID 500-334 MG/5ML PO SOLN: 30.0000 mL | ORAL | Status: DC | PRN

## 2019-05-14 MED ORDER — LIDOCAINE-EPINEPHRINE (PF) 2 %-1:200000 IJ SOLN
INTRAMUSCULAR | Status: DC | PRN
  Administered 2019-05-14 (×2): 2 mL via EPIDURAL

## 2019-05-14 MED ORDER — DIBUCAINE (PERIANAL) 1 % EX OINT: 1.0000 "application " | TOPICAL_OINTMENT | CUTANEOUS | Status: DC | PRN

## 2019-05-14 MED ORDER — ONDANSETRON HCL 4 MG PO TABS: 4.0000 mg | ORAL_TABLET | ORAL | Status: DC | PRN

## 2019-05-14 MED ORDER — PHENYLEPHRINE 40 MCG/ML (10ML) SYRINGE FOR IV PUSH (FOR BLOOD PRESSURE SUPPORT): 80.0000 ug | PREFILLED_SYRINGE | INTRAVENOUS | Status: DC | PRN

## 2019-05-14 MED ORDER — DIPHENHYDRAMINE HCL 50 MG/ML IJ SOLN: 12.5000 mg | INTRAMUSCULAR | Status: DC | PRN

## 2019-05-14 MED ORDER — LACTATED RINGERS IV SOLN
500.0000 mL | Freq: Once | INTRAVENOUS | Status: AC
  Administered 2019-05-14: 500 mL via INTRAVENOUS

## 2019-05-14 MED ORDER — BENZOCAINE-MENTHOL 20-0.5 % EX AERO
1.0000 "application " | INHALATION_SPRAY | CUTANEOUS | Status: DC | PRN
  Filled 2019-05-14: qty 56

## 2019-05-14 MED ORDER — EPHEDRINE 5 MG/ML INJ: 10.0000 mg | INTRAVENOUS | Status: DC | PRN

## 2019-05-14 MED ORDER — LACTATED RINGERS IV SOLN
500.0000 mL | INTRAVENOUS | Status: DC | PRN
  Administered 2019-05-14: 500 mL via INTRAVENOUS

## 2019-05-14 MED ORDER — SODIUM CHLORIDE 0.9 % IV SOLN
2.0000 g | Freq: Once | INTRAVENOUS | Status: AC
  Administered 2019-05-14: 2 g via INTRAVENOUS
  Filled 2019-05-14: qty 2000

## 2019-05-14 MED ORDER — WITCH HAZEL-GLYCERIN EX PADS: 1.0000 "application " | MEDICATED_PAD | CUTANEOUS | Status: DC | PRN

## 2019-05-14 MED ORDER — SODIUM CHLORIDE (PF) 0.9 % IJ SOLN
INTRAMUSCULAR | Status: DC | PRN
  Administered 2019-05-14: 12 mL/h via EPIDURAL

## 2019-05-14 MED ORDER — SENNOSIDES-DOCUSATE SODIUM 8.6-50 MG PO TABS
2.0000 | ORAL_TABLET | ORAL | Status: DC
  Administered 2019-05-15 – 2019-05-16 (×2): 2 via ORAL
  Filled 2019-05-14 (×2): qty 2

## 2019-05-14 MED ORDER — MEASLES, MUMPS & RUBELLA VAC IJ SOLR: 0.5000 mL | Freq: Once | INTRAMUSCULAR | Status: DC

## 2019-05-14 NOTE — Lactation Note (Signed)
This note was copied from a baby's chart. Lactation Consultation Note  Patient Name: Christie Hart Date: 05/14/2019 Reason for consult: Initial assessment;1st time breastfeeding;Primapara;Term  [employer] student visited w/ G1P1 mom for initial visit.  Mom had questions about different breastfeeding positions because her arm was getting tired. Mom is participating in [facility] and does have a breast pump at home. She is not sure of what type or kind of breast pump.  Mom expressed that she has seen some changes in her breast during pregnancy, as well as noticed leaking.    [facility] student entered room w/ mom feeding baby Christie in cross cradle hold.  [facility] student noticed baby was nursing just on nipple and offered to help mom w/ adjusting of position and latch.  During re- attempt of latch baby had more breast tissue in mouth with longer strokes in feeding patterns.  [facility] student didn't hear audible swallows but observed the longer feeding patterns in baby.  Mom offered baby left breast in a new position.  LC student helped mom position baby in football hold for left breast.  Mom expressed she liked this position better.  Baby only nursed for a couple of seconds before falling asleep. Baby nursed for a total of 25 minutes.  [facility] student noticed the nipple was slightly pinched after baby finished his nursing session.  Mom expressed she did not feel pinching from baby but did see the pinch nipple as well.  [facility] student discussed w/ mom about making sure baby has a deeper latch during next feeding so nipples are not sore. [facility] student taught mom hand expression and several drops of colostrum were seen.  LC student finger fed baby some of the drops of colostrum , and extra was rubbed on nipples.   The plan is for mom to continue to do STS, feed baby 8-12x w/in a 24hr period when feeding cues are noticed or earlier.  Mom is to do breast massage and hand expression before placing baby to breast.  BF brochure, feeding log, and  bf resources were given to mom during the visit.  Mom reported that all questions were answered and that she can call lactation when she has any questions or concerns prn.      Maternal Data Has patient been taught Hand Expression?: Yes Does the patient have breastfeeding experience prior to this delivery?: No  Feeding Feeding Type: Breast Fed  LATCH Score Latch: Repeated attempts needed to sustain latch, nipple held in mouth throughout feeding, stimulation needed to elicit sucking reflex.  Audible Swallowing: A few with stimulation  Type of Nipple: Everted at rest and after stimulation  Comfort (Breast/Nipple): Soft / non-tender  Hold (Positioning): Assistance needed to correctly position infant at breast and maintain latch.  LATCH Score: 7  Interventions Interventions: Breast feeding basics reviewed;Assisted with latch;Skin to skin;Breast massage;Hand express;Adjust position;Support pillows;Position options  Lactation Tools Discussed/Used WIC Program: Yes   Consult Status Consult Status: Follow-up Date: 05/15/19 Follow-up type: In-patient    Yvette Rack. Cicley Ganesh 05/14/2019, 7:15 PM

## 2019-05-14 NOTE — Anesthesia Procedure Notes (Signed)
Epidural Patient location during procedure: OB Start time: 05/14/2019 6:50 AM End time: 05/14/2019 7:05 AM  Staffing Anesthesiologist: Elmer Picker, MD Performed: anesthesiologist   Preanesthetic Checklist Completed: patient identified, IV checked, risks and benefits discussed, monitors and equipment checked, pre-op evaluation and timeout performed  Epidural Patient position: sitting Prep: DuraPrep and site prepped and draped Patient monitoring: continuous pulse ox, blood pressure, heart rate and cardiac monitor Approach: midline Location: L3-L4 Injection technique: LOR air  Needle:  Needle type: Tuohy  Needle gauge: 17 G Needle length: 9 cm Needle insertion depth: 5 cm Catheter type: closed end flexible Catheter size: 19 Gauge Catheter at skin depth: 11 cm Test dose: negative  Assessment Sensory level: T8 Events: blood not aspirated, injection not painful, no injection resistance, no paresthesia and negative IV test  Additional Notes Patient identified. Risks/Benefits/Options discussed with patient including but not limited to bleeding, infection, nerve damage, paralysis, failed block, incomplete pain control, headache, blood pressure changes, nausea, vomiting, reactions to medication both or allergic, itching and postpartum back pain. Confirmed with bedside nurse the patient's most recent platelet count. Confirmed with patient that they are not currently taking any anticoagulation, have any bleeding history or any family history of bleeding disorders. Patient expressed understanding and wished to proceed. All questions were answered. Sterile technique was used throughout the entire procedure. Please see nursing notes for vital signs. Test dose was given through epidural catheter and negative prior to continuing to dose epidural or start infusion. Warning signs of high block given to the patient including shortness of breath, tingling/numbness in hands, complete motor block, or  any concerning symptoms with instructions to call for help. Patient was given instructions on fall risk and not to get out of bed. All questions and concerns addressed with instructions to call with any issues or inadequate analgesia.  Reason for block:procedure for pain

## 2019-05-14 NOTE — Anesthesia Preprocedure Evaluation (Signed)
Anesthesia Evaluation  Patient identified by MRN, date of birth, ID band Patient awake    Reviewed: Allergy & Precautions, NPO status , Patient's Chart, lab work & pertinent test results  Airway Mallampati: II  TM Distance: >3 FB Neck ROM: Full    Dental no notable dental hx.    Pulmonary neg pulmonary ROS,    Pulmonary exam normal breath sounds clear to auscultation       Cardiovascular negative cardio ROS Normal cardiovascular exam Rhythm:Regular Rate:Normal     Neuro/Psych PSYCHIATRIC DISORDERS Depression Bipolar Disorder negative neurological ROS     GI/Hepatic negative GI ROS, Neg liver ROS,   Endo/Other  negative endocrine ROS  Renal/GU negative Renal ROS  negative genitourinary   Musculoskeletal negative musculoskeletal ROS (+)   Abdominal   Peds  (+) ADHD Hematology negative hematology ROS (+)   Anesthesia Other Findings   Reproductive/Obstetrics (+) Pregnancy                             Anesthesia Physical Anesthesia Plan  ASA: II  Anesthesia Plan: Epidural   Post-op Pain Management:    Induction:   PONV Risk Score and Plan: Treatment may vary due to age or medical condition  Airway Management Planned: Natural Airway  Additional Equipment:   Intra-op Plan:   Post-operative Plan:   Informed Consent: I have reviewed the patients History and Physical, chart, labs and discussed the procedure including the risks, benefits and alternatives for the proposed anesthesia with the patient or authorized representative who has indicated his/her understanding and acceptance.       Plan Discussed with: Anesthesiologist  Anesthesia Plan Comments: (Patient identified. Risks, benefits, options discussed with patient including but not limited to bleeding, infection, nerve damage, paralysis, failed block, incomplete pain control, headache, blood pressure changes, nausea,  vomiting, reactions to medication, itching, and post partum back pain. Confirmed with bedside nurse the patient's most recent platelet count. Confirmed with the patient that they are not taking any anticoagulation, have any bleeding history or any family history of bleeding disorders. Patient expressed understanding and wishes to proceed. All questions were answered. )        Anesthesia Quick Evaluation

## 2019-05-14 NOTE — Anesthesia Postprocedure Evaluation (Signed)
Anesthesia Post Note  Patient: Christie Hart  Procedure(s) Performed: AN AD HOC LABOR EPIDURAL     Patient location during evaluation: Mother Baby Anesthesia Type: Epidural Level of consciousness: awake and alert Pain management: pain level controlled Vital Signs Assessment: post-procedure vital signs reviewed and stable Respiratory status: spontaneous breathing, nonlabored ventilation and respiratory function stable Cardiovascular status: stable Postop Assessment: no headache, no backache and epidural receding Anesthetic complications: no    Last Vitals:  Vitals:   05/14/19 1201 05/14/19 1320  BP: 122/68 123/66  Pulse: (!) 57 65  Resp: (!) 21 14  Temp: 37.6 C 37.4 C  SpO2: 100% 100%    Last Pain:  Vitals:   05/14/19 1320  TempSrc: Oral  PainSc:    Pain Goal: Patients Stated Pain Goal: 3 (05/14/19 8902)                 [facility]ica Records

## 2019-05-14 NOTE — H&P (Signed)
Christie Hart is an 24 y.o. G1P0 [redacted]w[redacted]d white female who presents to the ER in labor. On admission she was reported to be 6 cm. PNC was complicated by + GBS. The pt has several psychiatric dxs. OGTT- wnl, Genetic testing wnl   Past Medical History:  Diagnosis Date  . ADHD (attention deficit hyperactivity disorder)   . Bipolar affective disorder (HCC)   . Depression   . Hx of chlamydia infection   . Sleep difficulties     Past Surgical History:  Procedure Laterality Date  . NO PAST SURGERIES      Family History  Problem Relation Age of Onset  . Diabetes Mother   . Hypertension Mother    Social History:  reports that she has never smoked. She has never used smokeless tobacco. She reports that she does not drink alcohol or use drugs.  Allergies: No Known Allergies  Medications Prior to Admission  Medication Sig Dispense Refill  . Prenat w/o A Vit-FeFum-FePo-FA (CONCEPT OB) 130-92.4-1 MG CAPS Take 1 tablet by mouth daily. 30 capsule 12  . hydrOXYzine (ATARAX/VISTARIL) 25 MG tablet Take 1 tablet (25 mg total) by mouth every 6 (six) hours. 12 tablet 0  . metroNIDAZOLE (FLAGYL) 500 MG tablet Take 1 tablet (500 mg total) by mouth 2 (two) times daily. 14 tablet 0  . promethazine (PHENERGAN) 25 MG tablet Take 1 tablet (25 mg total) by mouth every 6 (six) hours as needed for nausea or vomiting. 30 tablet 2       Blood pressure (!) 108/56, pulse 61, temperature 97.7 F (36.5 C), temperature source Oral, resp. rate 18, height 5\' 9"  (1.753 m), weight 78.9 kg, last menstrual period 08/03/2018, SpO2 100 %. General appearance: alert and cooperative Abdomen: gravid, non tender   Lab Results  Component Value Date   WBC 10.4 05/14/2019   HGB 14.2 05/14/2019   HCT 41.6 05/14/2019   MCV 93.1 05/14/2019   PLT 210 05/14/2019   Lab Results  Component Value Date   HCG >2,000.0 (H) 09/11/2018      Patient Active Problem List   Diagnosis Date Noted  . Indication for care in labor  or delivery 05/14/2019  . Major depressive disorder, recurrent severe without psychotic features (HCC) 11/14/2017   IMP/ IUP at term in labor        +GBS PLAN/ Admit            Tx GBS            Expect delivery  [name] 05/14/2019, 8:28 AM

## 2019-05-14 NOTE — MAU Note (Signed)
Pt reports to MAU c/o ctx every few min. No bleeding or LOF. +FM.

## 2019-05-15 ENCOUNTER — Other Ambulatory Visit (HOSPITAL_COMMUNITY): Payer: Medicaid Other | Attending: Obstetrics and Gynecology

## 2019-05-15 LAB — CBC
HCT: 36.2 % (ref 36.0–46.0)
Hemoglobin: 12.2 g/dL (ref 12.0–15.0)
MCH: 31.7 pg (ref 26.0–34.0)
MCHC: 33.7 g/dL (ref 30.0–36.0)
MCV: 94 fL (ref 80.0–100.0)
Platelets: 168 10*3/uL (ref 150–400)
RBC: 3.85 MIL/uL — ABNORMAL LOW (ref 3.87–5.11)
RDW: 12.6 % (ref 11.5–15.5)
WBC: 12.2 10*3/uL — ABNORMAL HIGH (ref 4.0–10.5)
nRBC: 0 % (ref 0.0–0.2)
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
## 2019-05-15 NOTE — Progress Notes (Signed)
Patient is doing well.  She is ambulating, voiding, tolerating PO.  Pain control is good.  Lochia is appropriate.  Working on breastfeeding  Vitals:   05/14/19 1641 05/14/19 2110 05/15/19 0151 05/15/19 0442  BP: 110/66 (!) 106/59 119/66 107/61  Pulse: 63 (!) 56 83 75  Resp: 16 18 16 18   Temp: 99.4 F (37.4 C) 98.1 F (36.7 C) 97.6 F (36.4 C) 99 F (37.2 C)  TempSrc: Oral Oral Oral Oral  SpO2: 99% 100% 99% 100%  Weight:      Height:        NAD Fundus firm Ext: no edema   Lab Results  Component Value Date   WBC 12.2 (H) 05/15/2019   HGB 12.2 05/15/2019   HCT 36.2 05/15/2019   MCV 94.0 05/15/2019   PLT 168 05/15/2019    --/--/O POS, O POS Performed at [facility], [address] [place], [place]  (03/07 0610)/RImmune  A/P 24 y.o. G1P1001 PPD#1. Routine care.    Expect d/c tomorrow.    [facility]

## 2019-05-15 NOTE — Lactation Note (Signed)
This note was copied from a baby's chart. Lactation Consultation Note  Patient Name: Christie Hart [id number] Date: 05/15/2019 Reason for consult: Follow-up assessment  P1 mother whose infant is now 38 hours old.  This is a term baby at 40+4 weeks.  Baby was asleep in mother's arms when I arrived.  She fed him approximately 2 hours ago.  Reviewed feeding cues and how to obtain a good deep latch.  Mother has had some difficulty with baby latching only to the nipple and she is slightly sore.  Offered to return at the next feeding to assist with latching and mother appreciative.  Called RN to update and she was able to help baby latch with the last feeding and informed me that baby latched and fed well.  Encouraged to continue feeding 8-12 times/24 hours or sooner if baby shows feeding cues.  Discussed cluster feeding.  Suggested mother feed STS with every feeding and to keep baby actively sucking while at the breast.  Mother verbalized understanding.  RN updated and mother will call for assistance with the next feeding.  No support person present at this time.  Mother has a DEBP for home use.   Maternal Data    Feeding Feeding Type: Breast Fed  LATCH Score Latch: Repeated attempts needed to sustain latch, nipple held in mouth throughout feeding, stimulation needed to elicit sucking reflex.  Audible Swallowing: A few with stimulation  Type of Nipple: Everted at rest and after stimulation  Comfort (Breast/Nipple): Soft / non-tender  Hold (Positioning): Assistance needed to correctly position infant at breast and maintain latch.  LATCH Score: 7  Interventions Interventions: Breast feeding basics reviewed;Assisted with latch;Breast massage;Hand express;Adjust position;Support pillows;Position options  Lactation Tools Discussed/Used     Consult Status Consult Status: Follow-up Date: 05/16/19 Follow-up type: In-patient    Qianna Clagett R Yareli Carthen 05/15/2019, 12:04 PM

## 2019-05-16 NOTE — Discharge Summary (Signed)
Obstetric Discharge Summary Reason for Admission: onset of labor Prenatal Procedures: ultrasound Intrapartum Procedures: spontaneous vaginal delivery Postpartum Procedures: none Complications-Operative and Postpartum: none Hemoglobin  Date Value Ref Range Status  05/15/2019 12.2 12.0 - 15.0 g/dL Final   HCT  Date Value Ref Range Status  05/15/2019 36.2 36.0 - 46.0 % Final    Physical Exam:  General: alert and cooperative Lochia: appropriate Uterine Fundus: firm DVT Evaluation: No evidence of DVT seen on physical exam.  Discharge Diagnoses: Term Pregnancy-delivered  Discharge Information: Date: 05/16/2019 Activity: pelvic rest Diet: routine Medications: PNV and Ibuprofen Condition: stable Instructions: refer to practice specific booklet Discharge to: home Follow-up Information    Levi Aland, MD Follow up in 4 week(s).   Specialty: Obstetrics and Gynecology Contact information: [address] [place] [address] [phone]           Newborn Data: Live born female  Birth Weight: 7 lb 12.7 oz (3535 g) APGAR: 8, 9  Newborn Delivery   Birth date/time: 05/14/2019 10:11:00 Delivery type: Vaginal, Spontaneous      Home with mother.  Philip Aspen 05/16/2019, 10:07 AM

## 2019-05-16 NOTE — Lactation Note (Signed)
This note was copied from a baby's chart. Lactation Consultation Note Baby 45 hrs old. 8% wt. Loss. Mom stated has been cluster feeding all night. Mom stated she didn't feel like the baby was getting anything. Cone shaped breast feel as if filling. Colostrum expressed. Mom has everted nipples that are sore. Has coconut oil to apply.   Mom shown how to use DEBP & how to disassemble, clean, & reassemble parts. Mom knows to pump q3h for 15-20 min. Milk storage and how to make hands free bra discussed.  Discussed feeding positions.  Mom had tried to supplement during the night w/Gerber but baby wouldn't feed.  LC attempted to assess suckle, baby wouldn't suckle on gloved finger. Baby has slightly high palate.  Baby has labial frenulum. Questionable tongue tie.  LC doesn't feel baby should be d/c home today.  Discussed w/mom baby probably needs supplemented. Give colostrum 1st. Patient Name: Christie Hart [id number] Date: 05/16/2019 Reason for consult: Follow-up assessment;Primapara;Term;Infant weight loss   Maternal Data Has patient been taught Hand Expression?: Yes Does the patient have breastfeeding experience prior to this delivery?: No  Feeding Feeding Type: Breast Fed  LATCH Score Latch: Grasps breast easily, tongue down, lips flanged, rhythmical sucking.  Audible Swallowing: A few with stimulation  Type of Nipple: Everted at rest and after stimulation  Comfort (Breast/Nipple): Filling, red/small blisters or bruises, mild/mod discomfort  Hold (Positioning): No assistance needed to correctly position infant at breast.  LATCH Score: 8  Interventions Interventions: Breast feeding basics reviewed;Support pillows;Position options;Skin to skin;Expressed milk;Breast massage;Hand express;Coconut oil;Breast compression;Adjust position;DEBP  Lactation Tools Discussed/Used Tools: Pump Breast pump type: Double-Electric Breast Pump   Consult Status Consult Status:  Follow-up Date: 05/16/19 Follow-up type: In-patient    Charyl Dancer 05/16/2019, 6:24 AM

## 2019-05-16 NOTE — Lactation Note (Signed)
This note was copied from a baby's chart. Lactation Consultation Note  Patient Name: Christie Hart [id number] Date: 05/16/2019 Reason for consult: Follow-up assessment Baby is 46 hours old/8% weight loss.  Mom and FOB currently sleeping in bed with baby.  Baby removed and placed back in crib.  Mom reports that baby is latching to breast but she doesn't have enough milk.  Baby was recently supplemented with 30 mls of formula.  Discussed milk coming to volume and the prevention and treatment of engorgement.  Mom has a breast pump at home.  Plan discussed to breastfeed with feeding cues, post pump every 3 hours giving any breast milk to baby.  If baby continues to act hungry supplement with formula using a slow flow nipple.  Instructed to call for latch assist if needed.  Reviewed outpatient services and encouraged to call prn.  Maternal Data Has patient been taught Hand Expression?: Yes Does the patient have breastfeeding experience prior to this delivery?: No  Feeding Feeding Type: Breast Milk with Formula added  LATCH Score Latch: Grasps breast easily, tongue down, lips flanged, rhythmical sucking.  Audible Swallowing: A few with stimulation  Type of Nipple: Everted at rest and after stimulation  Comfort (Breast/Nipple): Filling, red/small blisters or bruises, mild/mod discomfort  Hold (Positioning): No assistance needed to correctly position infant at breast.  LATCH Score: 8  Interventions Interventions: Breast feeding basics reviewed;Support pillows;Position options;Skin to skin;Expressed milk;Breast massage;Hand express;Coconut oil;Breast compression;Adjust position;DEBP  Lactation Tools Discussed/Used Tools: Pump Breast pump type: Double-Electric Breast Pump   Consult Status Consult Status: Follow-up Date: 05/17/19 Follow-up type: In-patient    Huston Foley 05/16/2019, 9:05 AM

## 2019-05-17 ENCOUNTER — Inpatient Hospital Stay (HOSPITAL_COMMUNITY): Payer: Medicaid Other

## 2019-05-17 ENCOUNTER — Inpatient Hospital Stay (HOSPITAL_COMMUNITY)
Admission: AD | Admit: 2019-05-17 | Payer: Medicaid Other | Source: Home / Self Care | Admitting: Obstetrics and Gynecology

## 2019-05-17 ENCOUNTER — Ambulatory Visit: Payer: Self-pay

## 2019-05-17 NOTE — Lactation Note (Signed)
This note was copied from a baby's chart. Lactation Consultation Note  Patient Name: Christie Hart [id number] Date: 05/17/2019 Reason for consult: Follow-up assessment;Term;[place] Baby is 71 hours old/7% weight loss.  Mom states breasts are feeling fuller.  Mom is latching baby to breast and then supplementing with formula.  Recommended weaning off the formula now that milk is coming in.  No questions or concerns.  Encouraged to call for assist prn.  Maternal Data    Feeding Feeding Type: Breast Milk  LATCH Score                   Interventions    Lactation Tools Discussed/Used     Consult Status Consult Status: Complete Follow-up type: Call as needed    Huston Foley 05/17/2019, 9:15 AM

## 2019-10-16 ENCOUNTER — Other Ambulatory Visit: Payer: Self-pay

## 2019-10-16 ENCOUNTER — Encounter (HOSPITAL_COMMUNITY): Payer: Self-pay | Admitting: Emergency Medicine

## 2019-10-16 ENCOUNTER — Emergency Department (HOSPITAL_COMMUNITY)
Admission: EM | Admit: 2019-10-16 | Discharge: 2019-10-16 | Disposition: A | Discharge status: Home / Self Care | Payer: Medicaid Other | Attending: Emergency Medicine | Admitting: Emergency Medicine

## 2019-10-16 DIAGNOSIS — R21 Rash and other nonspecific skin eruption: Secondary | ICD-10-CM | POA: Diagnosis present

## 2019-10-16 DIAGNOSIS — Z79899 Other long term (current) drug therapy: Secondary | ICD-10-CM | POA: Diagnosis not present

## 2019-10-16 DIAGNOSIS — F909 Attention-deficit hyperactivity disorder, unspecified type: Secondary | ICD-10-CM | POA: Insufficient documentation

## 2019-10-16 DIAGNOSIS — F329 Major depressive disorder, single episode, unspecified: Secondary | ICD-10-CM | POA: Insufficient documentation

## 2019-10-16 MED ORDER — DEXAMETHASONE 4 MG PO TABS
8.0000 mg | ORAL_TABLET | Freq: Once | ORAL | Status: AC
  Administered 2019-10-16: 8 mg via ORAL
  Filled 2019-10-16: qty 2

## 2019-10-16 MED ORDER — PERMETHRIN 5 % EX CREA: TOPICAL_CREAM | CUTANEOUS | 0 refills | Status: DC

## 2019-10-16 MED ORDER — DIPHENHYDRAMINE HCL 25 MG PO CAPS
25.0000 mg | ORAL_CAPSULE | Freq: Once | ORAL | Status: AC
  Administered 2019-10-16: 25 mg via ORAL
  Filled 2019-10-16: qty 1

## 2019-10-16 NOTE — ED Triage Notes (Signed)
Patient presents with generalized itchy skin rashes /hives onset yesterday , etiology unknown , respirations unlabored/no oral swelling , airway intact , denies fever or chills .

## 2019-10-16 NOTE — ED Provider Notes (Signed)
[facility] EMERGENCY DEPARTMENT Provider Note   CSN: [id number] Arrival date & time: 10/16/19  0330     History Chief Complaint  Patient presents with  . Allergic Reaction    Christie Hart is a 24 y.o. female.  The history is provided by the patient.  Allergic Reaction Presenting symptoms: itching and rash   Presenting symptoms: no difficulty breathing and no difficulty swallowing   Severity:  Mild Relieved by:  Nothing Worsened by:  Nothing Ineffective treatments:  None tried Patient has had a rash over the past day.  She reports she moved to a new home with her significant other and since then she has had a rash.  No one else at home has any symptoms.  She reports itchy rash to her hands and now has hives throughout her body.  No difficulty breathing or swallowing.  She has no history of significant allergic reaction     Past Medical History:  Diagnosis Date  . ADHD (attention deficit hyperactivity disorder)   . Bipolar affective disorder (HCC)   . Depression   . Hx of chlamydia infection   . Sleep difficulties     Patient Active Problem List   Diagnosis Date Noted  . Indication for care in labor or delivery 05/14/2019  . Pregnancy 05/14/2019  . Major depressive disorder, recurrent severe without psychotic features (HCC) 11/14/2017    Past Surgical History:  Procedure Laterality Date  . NO PAST SURGERIES       OB History    Gravida  1   Para  1   Term  1   Preterm      AB      Living  1     SAB      TAB      Ectopic      Multiple  0   Live Births  1           Family History  Problem Relation Age of Onset  . Diabetes Mother   . Hypertension Mother     Social History   Tobacco Use  . Smoking status: Never Smoker  . Smokeless tobacco: Never Used  Vaping Use  . Vaping Use: Never used  Substance Use Topics  . Alcohol use: No  . Drug use: No    Home Medications Prior to Admission medications     Medication Sig Start Date End Date Taking? Authorizing Provider  permethrin (ELIMITE) 5 % cream Apply to affected area once, you may repeat in 14 days if no improvement 10/16/19   Zadie Rhine, MD  Prenat w/o A Vit-FeFum-FePo-FA (CONCEPT OB) 130-92.4-1 MG CAPS Take 1 tablet by mouth daily. 09/11/18   Katrinka Blazing, [place], CNM    Allergies    Patient has no known allergies.  Review of Systems   Review of Systems  Constitutional: Negative for fever.  HENT: Negative for trouble swallowing.   Gastrointestinal: Negative for vomiting.  Skin: Positive for itching and rash.    Physical Exam Updated Vital Signs BP (!) 105/57 (BP Location: Right Arm)   Pulse 84   Temp 98.1 F (36.7 C) (Oral)   Resp 18   LMP 10/13/2019   SpO2 100%   Physical Exam CONSTITUTIONAL: Well developed/well nourished HEAD: Normocephalic/atraumatic EYES: EOMI/PERRL NECK: supple no meningeal signs LUNGS:  no apparent distress NEURO: Pt is awake/alert/appropriate, moves all extremitiesx4.  No facial droop.   EXTREMITIES:  full ROM SKIN: warm, color normal diffuse erythematous rash noted to upper  extremities and abdominal wall.  There are lesions noted to bilateral hands likely representative of scabies. PSYCH: no abnormalities of mood noted, alert and oriented to situation  ED Results / Procedures / Treatments   Labs (all labs ordered are listed, but only abnormal results are displayed) Labs Reviewed - No data to display  EKG None  Radiology No results found.  Procedures Procedures Medications Ordered in ED Medications  diphenhydrAMINE (BENADRYL) capsule 25 mg (25 mg Oral Given 10/16/19 0346)  dexamethasone (DECADRON) tablet 8 mg (8 mg Oral Given 10/16/19 0630)    ED Course  I have reviewed the triage vital signs and the nursing notes.     MDM Rules/Calculators/A&P                          She already improving with Benadryl.  Patient does have widespread rash, but rash on hands consistent with  scabies.  Will prescribe Elimite. Advised to continue Benadryl.  She was given a one-time dose of Decadron Final Clinical Impression(s) / ED Diagnoses Final diagnoses:  Rash    Rx / DC Orders ED Discharge Orders         Ordered    permethrin (ELIMITE) 5 % cream     Discontinue  Reprint     10/16/19 0629           Zadie Rhine, MD 10/16/19 [phone]

## 2019-10-18 ENCOUNTER — Encounter (HOSPITAL_COMMUNITY): Payer: Self-pay

## 2019-10-18 ENCOUNTER — Emergency Department (HOSPITAL_COMMUNITY)
Admission: EM | Admit: 2019-10-18 | Discharge: 2019-10-18 | Disposition: A | Discharge status: Home / Self Care | Payer: Medicaid Other | Attending: Emergency Medicine | Admitting: Emergency Medicine

## 2019-10-18 ENCOUNTER — Other Ambulatory Visit: Payer: Self-pay

## 2019-10-18 DIAGNOSIS — L509 Urticaria, unspecified: Secondary | ICD-10-CM | POA: Insufficient documentation

## 2019-10-18 DIAGNOSIS — Z5321 Procedure and treatment not carried out due to patient leaving prior to being seen by health care provider: Secondary | ICD-10-CM | POA: Diagnosis not present

## 2019-10-18 NOTE — ED Triage Notes (Signed)
Patient arrived with complaints of hives that she was seen yesterday for. Reports given a topical cream and benadryl with no relief. No known allergen. No SOB.

## 2019-10-18 NOTE — ED Notes (Signed)
I called patient in the lobby and outside to take back to a room and no one responded

## 2020-02-27 ENCOUNTER — Other Ambulatory Visit: Payer: Self-pay | Admitting: Obstetrics and Gynecology

## 2020-02-27 DIAGNOSIS — O26841 Uterine size-date discrepancy, first trimester: Secondary | ICD-10-CM

## 2020-04-21 ENCOUNTER — Other Ambulatory Visit: Payer: Self-pay

## 2020-04-21 ENCOUNTER — Observation Stay (HOSPITAL_COMMUNITY)
Admission: AD | Admit: 2020-04-21 | Discharge: 2020-04-22 | Disposition: A | Discharge status: Home / Self Care | Payer: Medicaid Other | Attending: Obstetrics and Gynecology | Admitting: Obstetrics and Gynecology

## 2020-04-21 ENCOUNTER — Encounter (HOSPITAL_COMMUNITY): Payer: Self-pay

## 2020-04-21 ENCOUNTER — Emergency Department (HOSPITAL_COMMUNITY)
Admission: EM | Admit: 2020-04-21 | Discharge: 2020-04-21 | Disposition: A | Discharge status: Home / Self Care | Payer: Medicaid Other | Source: Home / Self Care | Attending: Emergency Medicine | Admitting: Emergency Medicine

## 2020-04-21 ENCOUNTER — Emergency Department (HOSPITAL_COMMUNITY): Payer: Medicaid Other

## 2020-04-21 DIAGNOSIS — R1030 Lower abdominal pain, unspecified: Secondary | ICD-10-CM | POA: Insufficient documentation

## 2020-04-21 DIAGNOSIS — Z3A17 17 weeks gestation of pregnancy: Secondary | ICD-10-CM | POA: Insufficient documentation

## 2020-04-21 DIAGNOSIS — O26892 Other specified pregnancy related conditions, second trimester: Secondary | ICD-10-CM | POA: Insufficient documentation

## 2020-04-21 DIAGNOSIS — O0389 Complete or unspecified spontaneous abortion with other complications: Principal | ICD-10-CM | POA: Insufficient documentation

## 2020-04-21 DIAGNOSIS — N939 Abnormal uterine and vaginal bleeding, unspecified: Secondary | ICD-10-CM | POA: Insufficient documentation

## 2020-04-21 DIAGNOSIS — Z3490 Encounter for supervision of normal pregnancy, unspecified, unspecified trimester: Secondary | ICD-10-CM

## 2020-04-21 DIAGNOSIS — O0001 Abdominal pregnancy with intrauterine pregnancy: Secondary | ICD-10-CM | POA: Insufficient documentation

## 2020-04-21 DIAGNOSIS — O4690 Antepartum hemorrhage, unspecified, unspecified trimester: Secondary | ICD-10-CM

## 2020-04-21 LAB — CBC WITH DIFFERENTIAL/PLATELET
Abs Immature Granulocytes: 0.08 10*3/uL — ABNORMAL HIGH (ref 0.00–0.07)
Basophils Absolute: 0 10*3/uL (ref 0.0–0.1)
Basophils Relative: 0 %
Eosinophils Absolute: 0 10*3/uL (ref 0.0–0.5)
Eosinophils Relative: 0 %
HCT: 33 % — ABNORMAL LOW (ref 36.0–46.0)
Hemoglobin: 11.4 g/dL — ABNORMAL LOW (ref 12.0–15.0)
Immature Granulocytes: 1 %
Lymphocytes Relative: 8 %
Lymphs Abs: 1.2 10*3/uL (ref 0.7–4.0)
MCH: 30.8 pg (ref 26.0–34.0)
MCHC: 34.5 g/dL (ref 30.0–36.0)
MCV: 89.2 fL (ref 80.0–100.0)
Monocytes Absolute: 1 10*3/uL (ref 0.1–1.0)
Monocytes Relative: 6 %
Neutro Abs: 13 10*3/uL — ABNORMAL HIGH (ref 1.7–7.7)
Neutrophils Relative %: 85 %
Platelets: 173 10*3/uL (ref 150–400)
RBC: 3.7 MIL/uL — ABNORMAL LOW (ref 3.87–5.11)
RDW: 13 % (ref 11.5–15.5)
WBC: 15.2 10*3/uL — ABNORMAL HIGH (ref 4.0–10.5)
nRBC: 0 % (ref 0.0–0.2)

## 2020-04-21 LAB — BASIC METABOLIC PANEL
Anion gap: 9 (ref 5–15)
BUN: 10 mg/dL (ref 6–20)
CO2: 20 mmol/L — ABNORMAL LOW (ref 22–32)
Calcium: 8.9 mg/dL (ref 8.9–10.3)
Chloride: 105 mmol/L (ref 98–111)
Creatinine, Ser: 0.41 mg/dL — ABNORMAL LOW (ref 0.44–1.00)
GFR, Estimated: >60 mL/min (ref >60)
Glucose, Bld: 81 mg/dL (ref 70–99)
Potassium: 3.4 mmol/L — ABNORMAL LOW (ref 3.5–5.1)
Sodium: 134 mmol/L — ABNORMAL LOW (ref 135–145)

## 2020-04-21 LAB — ABO/RH: ABO/RH(D): O POS

## 2020-04-21 LAB — HCG, QUANTITATIVE, PREGNANCY: hCG, Beta Chain, Quant, S: 13524 m[IU]/mL — ABNORMAL HIGH (ref <5)

## 2020-04-21 NOTE — ED Triage Notes (Signed)
Pt reports vaginal bleeding beginning 2 hours prior to arrival. Passing large clots. Denies using pads. [redacted] weeks pregnant.

## 2020-04-21 NOTE — Discharge Instructions (Signed)
Please call your OB provider tomorrow morning to schedule close follow up this week. If your bleeding or pain significantly worsen, please call your OB office and report to the maternity assessment unit at [facility]. Entrance C (follow signs for labor and delivery)

## 2020-04-21 NOTE — ED Provider Notes (Signed)
This patient is G2, P1 at approximately 17 weeks by last known.,  Has seen her OB/GYN and had fetal heart tones but no formal ultrasound.  Started having some vaginal bleeding this evening, several clots, some spotting, occasional cramping, feels good at this time.  On exam she has a palpable uterus which is below the level of the umbilicus.  She has no specific guarding or significant tenderness.  Bedside ultrasound reveals a live intrauterine pregnancy with normal fetal movements and a normal fetal heart rate of approximately 150 bpm.  Patient is well-appearing, formal ultrasound pending, anticipate discharge, has follow-up on 23 February  Medical screening examination/treatment/procedure(s) were conducted as a shared visit with non-physician practitioner(s) and myself.  I personally evaluated the patient during the encounter.  Clinical Impression:   Final diagnoses:  Vaginal bleeding in pregnancy  Intrauterine pregnancy         Eber Hong, MD 04/22/20 2219

## 2020-04-21 NOTE — ED Provider Notes (Signed)
[facility]-EMERGENCY DEPT Provider Note   CSN: [id number] Arrival date & time: 04/21/20  1908     History Chief Complaint  Patient presents with  . Vaginal Bleeding    Christie Hart is a 25 y.o. female G2, P1, [redacted] weeks gestation, presenting to the emergency department complaint of vaginal bleeding that began around 6 PM this evening.  She reports passing some clots though bleeding is overall light and less than a menstrual period.  She is having some associated lower abdominal cramping.  No leakage of fluid.  No fevers or chills.  No urinary symptoms.  Has not yet felt baby move. She is followed by OB and has had fetal heart tones measured at her prenatal visits, however is not scheduled for a formal ultrasound until later this month.  She had uncomplicated first pregnancy, carried full-term.  The history is provided by the patient.       Past Medical History:  Diagnosis Date  . ADHD (attention deficit hyperactivity disorder)   . Bipolar affective disorder (HCC)   . Depression   . Hx of chlamydia infection   . Sleep difficulties     Patient Active Problem List   Diagnosis Date Noted  . Indication for care in labor or delivery 05/14/2019  . Pregnancy 05/14/2019  . Major depressive disorder, recurrent severe without psychotic features (HCC) 11/14/2017    Past Surgical History:  Procedure Laterality Date  . NO PAST SURGERIES       OB History    Gravida  1   Para  1   Term  1   Preterm      AB      Living  1     SAB      IAB      Ectopic      Multiple  0   Live Births  1           Family History  Problem Relation Age of Onset  . Diabetes Mother   . Hypertension Mother     Social History   Tobacco Use  . Smoking status: Never Smoker  . Smokeless tobacco: Never Used  Vaping Use  . Vaping Use: Never used  Substance Use Topics  . Alcohol use: No  . Drug use: No    Home Medications Prior to Admission  medications   Medication Sig Start Date End Date Taking? Authorizing Provider  permethrin (ELIMITE) 5 % cream Apply to affected area once, you may repeat in 14 days if no improvement 10/16/19   Zadie Rhine, MD  Prenat w/o A Vit-FeFum-FePo-FA (CONCEPT OB) 130-92.4-1 MG CAPS Take 1 tablet by mouth daily. 09/11/18   Katrinka Blazing, [place], CNM    Allergies    Patient has no known allergies.  Review of Systems   Review of Systems  Gastrointestinal: Positive for abdominal pain.  Genitourinary: Positive for vaginal bleeding.  All other systems reviewed and are negative.   Physical Exam Updated Vital Signs BP 126/72 (BP Location: Right Arm)   Pulse 97   Temp 98 F (36.7 C) (Oral)   Resp 18   Ht 5\' 8"  (1.727 m)   Wt 64.4 kg   SpO2 100%   BMI 21.59 kg/m   Physical Exam Vitals and nursing note reviewed.  Constitutional:      General: She is not in acute distress.    Appearance: She is well-developed and well-nourished.  HENT:     Head: Normocephalic and atraumatic.  Eyes:  Conjunctiva/sclera: Conjunctivae normal.  Cardiovascular:     Rate and Rhythm: Normal rate and regular rhythm.  Pulmonary:     Effort: Pulmonary effort is normal. No respiratory distress.     Breath sounds: Normal breath sounds.  Abdominal:     General: Bowel sounds are normal.     Palpations: Abdomen is soft.     Tenderness: There is abdominal tenderness. There is no guarding or rebound.     Comments: Palpable uterus below the umbilicus. TTP suprapubic and RLQ  Skin:    General: Skin is warm.  Neurological:     Mental Status: She is alert.  Psychiatric:        Mood and Affect: Mood and affect normal.        Behavior: Behavior normal.     ED Results / Procedures / Treatments   Labs (all labs ordered are listed, but only abnormal results are displayed) Labs Reviewed  BASIC METABOLIC PANEL - Abnormal; Notable for the following components:      Result Value   Sodium 134 (*)    Potassium 3.4 (*)     CO2 20 (*)    Creatinine, Ser 0.41 (*)    All other components within normal limits  CBC WITH DIFFERENTIAL/PLATELET - Abnormal; Notable for the following components:   WBC 15.2 (*)    RBC 3.70 (*)    Hemoglobin 11.4 (*)    HCT 33.0 (*)    Neutro Abs 13.0 (*)    Abs Immature Granulocytes 0.08 (*)    All other components within normal limits  HCG, QUANTITATIVE, PREGNANCY  ABO/RH    EKG None  Radiology US OB Limited > 14 wks  Result Date: 04/21/2020 CLINICAL DATA:  Pelvic pain and vaginal bleeding.  Pregnant patient. EXAM: LIMITED OBSTETRIC ULTRASOUND FINDINGS: Number of Fetuses: 1 Heart Rate:  151 bpm Movement: Yes Presentation: Cephalic Placental Location: Posterior Previa: No Amniotic Fluid (Subjective):  Within normal limits. FL: 2.51 cm 17 w  4 d MATERNAL FINDINGS: Cervix:  Appears closed. Uterus/Adnexae: No uterine masses. Normal right ovary. Left ovary not visualized. No adnexal masses. Trace amount of right adnexal free fluid. IMPRESSION: 1. Single live intrauterine pregnancy with a measured gestational age of [redacted] weeks and 4 days. No pregnancy complication. No findings to account for pelvic pain. This exam is performed on an emergent basis and does not comprehensively evaluate fetal size, dating, or anatomy; follow-up complete OB US should be considered if further fetal assessment is warranted. Electronically Signed   By: Amie Portland M.D.   On: 04/21/2020 20:52    Procedures Procedures   Medications Ordered in ED Medications - No data to display  ED Course  I have reviewed the triage vital signs and the nursing notes.  Pertinent labs & imaging results that were available during my care of the patient were reviewed by me and considered in my medical decision making (see chart for details).    MDM Rules/Calculators/A&P                          Patient is G2, P1 estimated [redacted] weeks gestation, presenting with vaginal bleeding and cramping that began today.  First pregnancy  was uncomplicated, carried to term.  She is followed by OB, has not yet had a formal ultrasound.  Her bleeding is light today, no leakage of fluid, or contractions.  Bedside ultrasound is performed that shows live intrauterine pregnancy with good cardiac movement. This is  followed by formal OB ultrasound which confirms single live intrauterine pregnancy, measuring 17 weeks 4 days gestation.  No complications found on ultrasound.  Recommend patient follow closely with her OB specialist this week, return as needed for worsening symptoms.  Patient recommended to report to the MAU at [facility] should she need any further urgent care.  Discussed results, findings, treatment and follow up. Patient advised of return precautions. Patient verbalized understanding and agreed with plan. Final Clinical Impression(s) / ED Diagnoses Final diagnoses:  Vaginal bleeding in pregnancy  Intrauterine pregnancy    Rx / DC Orders ED Discharge Orders    None       Baylea Milburn, [place], PA-C 04/21/20 2107    Eber Hong, MD 04/22/20 2219

## 2020-04-21 NOTE — MAU Note (Signed)
PT SAYS SHE WENT TO [facility] TONIGHT AT 6-9P FOR CRAMPING AND SPOTTING..    THEN SROM AT 2330-  THEN EMS ARRIVED .  EN ROUTE  BLEEDING INCREASED.

## 2020-04-22 ENCOUNTER — Encounter (HOSPITAL_COMMUNITY): Payer: Self-pay | Admitting: Obstetrics and Gynecology

## 2020-04-22 DIAGNOSIS — O42919 Preterm premature rupture of membranes, unspecified as to length of time between rupture and onset of labor, unspecified trimester: Secondary | ICD-10-CM

## 2020-04-22 LAB — CBC
HCT: 26.3 % — ABNORMAL LOW (ref 36.0–46.0)
Hemoglobin: 9 g/dL — ABNORMAL LOW (ref 12.0–15.0)
MCH: 30.4 pg (ref 26.0–34.0)
MCHC: 34.2 g/dL (ref 30.0–36.0)
MCV: 88.9 fL (ref 80.0–100.0)
Platelets: 170 10*3/uL (ref 150–400)
RBC: 2.96 MIL/uL — ABNORMAL LOW (ref 3.87–5.11)
RDW: 13.1 % (ref 11.5–15.5)
WBC: 13 10*3/uL — ABNORMAL HIGH (ref 4.0–10.5)
nRBC: 0 % (ref 0.0–0.2)

## 2020-04-22 MED ORDER — PROMETHAZINE HCL 25 MG/ML IJ SOLN
25.0000 mg | Freq: Four times a day (QID) | INTRAMUSCULAR | Status: DC | PRN
  Administered 2020-04-22: 25 mg via INTRAVENOUS

## 2020-04-22 MED ORDER — PRENATAL MULTIVITAMIN CH: 1.0000 | ORAL_TABLET | Freq: Every day | ORAL | Status: DC

## 2020-04-22 MED ORDER — MISOPROSTOL 200 MCG PO TABS
1000.0000 ug | ORAL_TABLET | Freq: Once | ORAL | Status: AC
  Administered 2020-04-22: 1000 ug via RECTAL
  Filled 2020-04-22: qty 5

## 2020-04-22 MED ORDER — CALCIUM CARBONATE ANTACID 500 MG PO CHEW: 2.0000 | CHEWABLE_TABLET | ORAL | Status: DC | PRN

## 2020-04-22 MED ORDER — ZOLPIDEM TARTRATE 5 MG PO TABS: 5.0000 mg | ORAL_TABLET | Freq: Every evening | ORAL | Status: DC | PRN

## 2020-04-22 MED ORDER — ACETAMINOPHEN 325 MG PO TABS: 650.0000 mg | ORAL_TABLET | ORAL | Status: DC | PRN

## 2020-04-22 MED ORDER — SODIUM CHLORIDE 0.9 % IV SOLN: 250.0000 mL | INTRAVENOUS | Status: DC | PRN

## 2020-04-22 MED ORDER — SODIUM CHLORIDE 0.9% FLUSH
3.0000 mL | Freq: Two times a day (BID) | INTRAVENOUS | Status: DC
  Administered 2020-04-22: 3 mL via INTRAVENOUS

## 2020-04-22 MED ORDER — PROMETHAZINE HCL 25 MG/ML IJ SOLN
25.0000 mg | Freq: Once | INTRAMUSCULAR | Status: DC
  Filled 2020-04-22: qty 1

## 2020-04-22 MED ORDER — OXYTOCIN 10 UNIT/ML IJ SOLN
40.0000 [IU] | Freq: Once | INTRAMUSCULAR | Status: AC
  Administered 2020-04-22: 40 [IU]
  Filled 2020-04-22: qty 4

## 2020-04-22 MED ORDER — HYDROMORPHONE HCL 1 MG/ML IJ SOLN
1.0000 mg | INTRAMUSCULAR | Status: DC | PRN
  Administered 2020-04-22: 1 mg via INTRAVENOUS
  Filled 2020-04-22: qty 1

## 2020-04-22 MED ORDER — SODIUM CHLORIDE 0.9% FLUSH: 3.0000 mL | INTRAVENOUS | Status: DC | PRN

## 2020-04-22 MED ORDER — DOCUSATE SODIUM 100 MG PO CAPS: 100.0000 mg | ORAL_CAPSULE | Freq: Every day | ORAL | Status: DC

## 2020-04-22 MED ORDER — IBUPROFEN 800 MG PO TABS: 800.0000 mg | ORAL_TABLET | Freq: Three times a day (TID) | ORAL | Status: DC | PRN

## 2020-04-22 MED ORDER — OXYTOCIN 10 UNIT/ML IJ SOLN
40.0000 [IU] | Freq: Once | INTRAMUSCULAR | Status: AC
  Administered 2020-04-22: 40 [IU] via INTRAMUSCULAR
  Filled 2020-04-22: qty 4

## 2020-04-22 NOTE — MAU Note (Signed)
Pt requests funeral home services for cremation of fetus.  Funeral home information given to patient, informed pt that Dr. Claiborne Billings will complete funeral certificate in the morning.  Fetus taken to morgue for storage prior to funeral home pickup by Tia Alert, RN.

## 2020-04-22 NOTE — H&P (Signed)
Please see detailed CNM note below.  Briefly, this is a 25 y.o. G2P1001 [redacted]w[redacted]d who represented this am after being sent home from [facility] with cramping to MAU after ROM.   Pt was C/C+3 in MAU and delivered breech fetus.  Fetus was size appropriate for age, female and without any obvious anatomic anomalies.  Fetus appeared to me recently deceased.    She was given pitocin and cytotec by MAU staff.  I was called and alerted that the pt had not yet delivered the placenta in over an hour.  I arrived to evaluate pt and with some pushing, she delivered an intact placenta.  However, she had about 500 cc of additional clot expelled at the same time to make total blood loss at 1000 cc.  I d/w the patient keeping her to recheck her H/H in 6 hours and make sure she is not significantly anemic or symptomatic.  Currently she denies any dizziness.  Pt is also very upset by the loss of the baby and would benefit from a Chaplain visit.  I d/w pt that there was nothing she could have done to prevent or cause this.  I discussed with her that the picture is not entirely clear if this was an in competent cervix or not; the [place] at [facility] showed an apparently closed cervix (was not measured) but the speed of the loss may be consistent with that.   Vitals:   04/22/20 0003  BP: 119/61  Pulse: 99  Resp: (!) 24  Temp: 98.9 F (37.2 C)  TempSrc: Oral  SpO2: 100%  Weight: 64.9 kg  Height: 5\' 8"  (1.727 m)   A/P S/p complete Ab at 25 3/7 weeks with excessive blood loss.  Admit for repeat h/h.  Blood type O Pos.  5/7   CNM :  Christie Hart is a 25 y.o. year old G31P1001 female at [redacted]w[redacted]d weeks gestation who presents to MAU via EMS reporting leaking fluid since 2330 and VB. Her VB started at 1830 tonight. She was seen at [facility] for cramping and "told everything was alright." In review of the MD note from [facility], there was an informal bedside U/S performed showing a live intrauterine fetus with active movements and  (+) cardiac activity at 150 bpm. She is a patient of [facility] at [facility].            OB History    Gravida  2   Para  1   Term  1   Preterm      AB      Living  1     SAB      IAB      Ectopic      Multiple  0   Live Births  1               Past Medical History:  Diagnosis Date  . ADHD (attention deficit hyperactivity disorder)   . Bipolar affective disorder (HCC)   . Depression   . Hx of chlamydia infection   . Sleep difficulties          Past Surgical History:  Procedure Laterality Date  . NO PAST SURGERIES           Family History  Problem Relation Age of Onset  . Diabetes Mother   . Hypertension Mother     Social History       Tobacco Use  . Smoking status: Never Smoker  .  Smokeless tobacco: Never Used  Vaping Use  . Vaping Use: Never used  Substance Use Topics  . Alcohol use: No  . Drug use: No    Allergies: No Known Allergies         Medications Prior to Admission  Medication Sig Dispense Refill Last Dose  . Prenat w/o A Vit-FeFum-FePo-FA (CONCEPT OB) 130-92.4-1 MG CAPS Take 1 tablet by mouth daily. 30 capsule 12 04/21/2020 at Unknown time  . permethrin (ELIMITE) 5 % cream Apply to affected area once, you may repeat in 14 days if no improvement 60 g 0     Review of Systems  Constitutional: Negative.   HENT: Negative.   Eyes: Negative.   Respiratory: Negative.   Cardiovascular: Negative.   Gastrointestinal: Negative.   Endocrine: Negative.   Genitourinary: Positive for pelvic pain (cramping) and vaginal bleeding (leaking fluid).  Musculoskeletal: Negative.   Skin: Negative.   Allergic/Immunologic: Negative.   Neurological: Negative.   Hematological: Negative.   Psychiatric/Behavioral: Negative.    Physical Exam   Blood pressure 119/61, pulse 99, temperature 98.9 F (37.2 C), temperature source Oral, resp. rate (!) 24, height 5\' 8"  (1.727 m), weight 64.9 kg, last  menstrual period 10/13/2019, SpO2 100 %, unknown if currently breastfeeding.  Physical Exam Vitals and nursing note reviewed. Exam conducted with a chaperone present.  Constitutional:      Appearance: Normal appearance. She is normal weight.  Cardiovascular:     Rate and Rhythm: Normal rate.     Pulses: Normal pulses.  Pulmonary:     Effort: Pulmonary effort is normal.  Abdominal:     General: Abdomen is flat.  Genitourinary:    General: Normal vulva.     Comments: Large amount of fluid, blood and blood clots on peripad  fetus delivered out of vaginal introitus to level of chest (breech presentation)  entire fetus delivered 1 minute afterwards, cord clamped x 2 and cut without difficulty; fetus placed in specimen container. Pitocin 40 units injected in umbilical cord Musculoskeletal:     Cervical back: Normal range of motion.  Skin:    General: Skin is warm and dry.  Neurological:     Mental Status: She is alert and oriented to person, place, and time.  Psychiatric:        Mood and Affect: Mood normal.        Behavior: Behavior normal.        Thought Content: Thought content normal.        Judgment: Judgment normal.     MAU Course  Procedures  MDM LR 1000 ml @ 125 ml/hr Pitocin 40 units IM and in umbilical cord Dilaudid 1 mg IVP  *Consult with Dr. 12/13/2019 @ 00 - notified of patient's complaints, assessments, lab & U/S results, recommended tx plan administer Pitocin 40 units in umbilical cord and 40 units in the IV bag - call Dr. 01 to see how she wants patient handled  Dr. Henderson Cloud returned call @ 0150 - notified of patient's delivery and tx plan -- will come see the patient

## 2020-04-22 NOTE — Discharge Summary (Signed)
Postpartum Discharge Summary  Patient Name: CENA BRUHN DOB: 26-Jun-1995 MRN: [id number]  Date of admission: 04/21/2020 Delivery date:This patient has no babies on file. Delivering provider: This patient has no babies on file. Date of discharge: 04/22/2020  Admitting diagnosis: Postpartum state [Z39.2] Intrauterine pregnancy: [redacted]w[redacted]d     Secondary diagnosis:  Active Problems:   Postpartum state  Additional problems: preterm, pre-viable vaginal delivery at 17weeks 3days, delayed placenta delivery, EBL 1000cc    Discharge diagnosis: Preterm Pregnancy Delivered                                              Post partum procedures:none Augmentation: N/A Complications: [facility] course: Onset of Labor With Vaginal Delivery      25 y.o. yo G2P1001 at [redacted]w[redacted]d was admitted in Active Labor on 04/21/2020. Patient had an uncomplicated labor course as follows:  Membrane Rupture Time/Date: This patient has no babies on file.,This patient has no babies on file.  Delivery Method:This patient has no babies on file. Episiotomy: This patient has no babies on file. Lacerations:  This patient has no babies on file. Patient had an uncomplicated postpartum course.  She is ambulating, tolerating a regular diet, passing flatus, and urinating well. Patient is discharged home in stable condition on 04/22/20.  Newborn Data: Birth date:This patient has no babies on file. Birth time:This patient has no babies on file. Gender:This patient has no babies on file. Living status:This patient has no babies on file. Apgars:This patient has no babies on file.,This patient has no babies on file. Weight:This patient has no babies on file.   Magnesium Sulfate received: No BMZ received: No Rhophylac:N/A MMR:N/A T-DaP:see office note Flu: N/A Transfusion:No  Physical exam  Vitals:   04/22/20 0235 04/22/20 0344 04/22/20 0827 04/22/20 1210  BP: (!) 117/56 (!) 101/43 (!) 121/51 (!) 110/49  Pulse:  77 66 87 90  Resp:  $Remo'18 18 18  'CvZDJ$ Temp:  98 F (36.7 C) 98.5 F (36.9 C) 98.6 F (37 C)  TempSrc:  Oral Oral Oral  SpO2:  100% 100% 98%  Weight:      Height:       General: alert, cooperative and no distress Lochia: appropriate Uterine Fundus: firm DVT Evaluation: No evidence of DVT seen on physical exam. Labs: Lab Results  Component Value Date   WBC 13.0 (H) 04/22/2020   HGB 9.0 (L) 04/22/2020   HCT 26.3 (L) 04/22/2020   MCV 88.9 04/22/2020   PLT 170 04/22/2020   CMP Latest Ref Rng & Units 04/21/2020  Glucose 70 - 99 mg/dL 81  BUN 6 - 20 mg/dL 10  Creatinine 0.44 - 1.00 mg/dL 0.41(L)  Sodium 135 - 145 mmol/L 134(L)  Potassium 3.5 - 5.1 mmol/L 3.4(L)  Chloride 98 - 111 mmol/L 105  CO2 22 - 32 mmol/L 20(L)  Calcium 8.9 - 10.3 mg/dL 8.9  Total Protein 6.5 - 8.1 g/dL -  Total Bilirubin 0.3 - 1.2 mg/dL -  Alkaline Phos 38 - 126 U/L -  AST 15 - 41 U/L -  ALT 0 - 44 U/L -   Edinburgh Score: Edinburgh Postnatal Depression Scale Screening Tool 05/14/2019  I have been able to laugh and see the funny side of things. (No Data)      After visit meds:  Allergies as of 04/22/2020   No Known Allergies  Medication List    TAKE these medications   prenatal multivitamin Tabs tablet Take 1 tablet by mouth daily at 12 noon.        Discharge home in stable condition Infant Feeding: N/A Infant Disposition:morgue for cremation Discharge instruction: per After Visit Summary and Postpartum booklet. Activity: Advance as tolerated. Pelvic rest for 6 weeks.  Diet: routine diet Anticipated Birth Control: Unsure Postpartum Appointment:4 weeks Additional Postpartum F/U: Postpartum Depression checkup Future Appointments:No future appointments. Follow up Visit:  Follow-up Information    Bobbye Charleston, MD Follow up in 4 week(s).   Specialty: Obstetrics and Gynecology Contact information: [address] [place] [address] [phone]                    04/22/2020 Allyn Kenner, DO

## 2020-04-22 NOTE — MAU Note (Signed)
Pt transferred to [facility] via stretcher in stable condition.  Bedside handoff report given.

## 2020-04-22 NOTE — MAU Provider Note (Addendum)
History     CSN: [id number]  Arrival date and time: 04/21/20 2355   Event Date/Time   First Provider Initiated Contact with Patient 04/22/20 0006      Chief Complaint  Patient presents with  . Abdominal Pain  . Vaginal Bleeding   [name] is a 25 y.o. year old G73P1001 female at [redacted]w[redacted]d weeks gestation who presents to MAU via EMS reporting leaking fluid since 2330 and VB. Her VB started at 1830 tonight. She was seen at [facility] for cramping and "told everything was alright." In review of the MD note from [facility], there was an informal bedside U/S performed showing a live intrauterine fetus with active movements and (+) cardiac activity at 150 bpm. She is a patient of Dr. Claiborne Billings at [facility].    OB History    Gravida  2   Para  1   Term  1   Preterm      AB      Living  1     SAB      IAB      Ectopic      Multiple  0   Live Births  1           Past Medical History:  Diagnosis Date  . ADHD (attention deficit hyperactivity disorder)   . Bipolar affective disorder (HCC)   . Depression   . Hx of chlamydia infection   . Sleep difficulties     Past Surgical History:  Procedure Laterality Date  . NO PAST SURGERIES      Family History  Problem Relation Age of Onset  . Diabetes Mother   . Hypertension Mother     Social History   Tobacco Use  . Smoking status: Never Smoker  . Smokeless tobacco: Never Used  Vaping Use  . Vaping Use: Never used  Substance Use Topics  . Alcohol use: No  . Drug use: No    Allergies: No Known Allergies  Medications Prior to Admission  Medication Sig Dispense Refill Last Dose  . Prenat w/o A Vit-FeFum-FePo-FA (CONCEPT OB) 130-92.4-1 MG CAPS Take 1 tablet by mouth daily. 30 capsule 12 04/21/2020 at Unknown time  . permethrin (ELIMITE) 5 % cream Apply to affected area once, you may repeat in 14 days if no improvement 60 g 0     Review of Systems  Constitutional: Negative.   HENT: Negative.    Eyes: Negative.   Respiratory: Negative.   Cardiovascular: Negative.   Gastrointestinal: Negative.   Endocrine: Negative.   Genitourinary: Positive for pelvic pain (cramping) and vaginal bleeding (leaking fluid).  Musculoskeletal: Negative.   Skin: Negative.   Allergic/Immunologic: Negative.   Neurological: Negative.   Hematological: Negative.   Psychiatric/Behavioral: Negative.    Physical Exam   Blood pressure 119/61, pulse 99, temperature 98.9 F (37.2 C), temperature source Oral, resp. rate (!) 24, height 5\' 8"  (1.727 m), weight 64.9 kg, last menstrual period 10/13/2019, SpO2 100 %, unknown if currently breastfeeding.  Physical Exam Vitals and nursing note reviewed. Exam conducted with a chaperone present.  Constitutional:      Appearance: Normal appearance. She is normal weight.  Cardiovascular:     Rate and Rhythm: Normal rate.     Pulses: Normal pulses.  Pulmonary:     Effort: Pulmonary effort is normal.  Abdominal:     General: Abdomen is flat.  Genitourinary:    General: Normal vulva.     Comments: Large amount of fluid,  blood and blood clots on peripad  fetus delivered out of vaginal introitus to level of chest (breech presentation)  entire fetus delivered 1 minute afterwards, cord clamped x 2 and cut without difficulty; fetus placed in specimen container. Pitocin 40 units injected in umbilical cord Musculoskeletal:     Cervical back: Normal range of motion.  Skin:    General: Skin is warm and dry.  Neurological:     Mental Status: She is alert and oriented to person, place, and time.  Psychiatric:        Mood and Affect: Mood normal.        Behavior: Behavior normal.        Thought Content: Thought content normal.        Judgment: Judgment normal.     MAU Course  Procedures Spontaneous vaginal delivery of non-viable preterm fetus in a breech position @ 1214. Gender appears to be female. VB moderate with clots. Cord clamped x 2; gentle tractions applied to  cord with kelly clamp left attached. Offered patient to see fetus; patient declined at this time. Fetus in specimen container. Pitocin 40 units injected into cord, 40 units given IM.  EBL 970 ml by Triton measurements.  MDM LR 1000 ml @ 125 ml/hr Pitocin 40 units IM and in umbilical cord Dilaudid 1 mg IVP Phenergan 25 mg IVP  *Consult with Dr. Shawnie Pons @ 0030 - notified of patient's complaints, assessments, lab & U/S results, recommended tx plan administer Pitocin 40 units in umbilical cord and 40 units in the IV bag - call Dr. Henderson Cloud to see how she wants patient handled  Dr. Henderson Cloud returned call @ [phone] - notified of patient's delivery and tx plan -- will come see the patient  Dr. Henderson Cloud on the unit and assumed care of patient at 0210.   Formal U/S from [facility]: Narrative & Impression  CLINICAL DATA:  Pelvic pain and vaginal bleeding.  Pregnant patient.  EXAM: LIMITED OBSTETRIC ULTRASOUND  FINDINGS: Number of Fetuses: 1  Heart Rate:  151 bpm  Movement: Yes  Presentation: Cephalic  Placental Location: Posterior  Previa: No  Amniotic Fluid (Subjective):  Within normal limits.  FL: 2.51 cm 17 w  4 d  MATERNAL FINDINGS:  Cervix:  Appears closed.  Uterus/Adnexae: No uterine masses. Normal right ovary. Left ovary not visualized. No adnexal masses. Trace amount of right adnexal free fluid.  IMPRESSION: 1. Single live intrauterine pregnancy with a measured gestational age of [redacted] weeks and 4 days. No pregnancy complication. No findings to account for pelvic pain.  This exam is performed on an emergent basis and does not comprehensively evaluate fetal size, dating, or anatomy; follow-up complete OB US should be considered if further fetal assessment is warranted.   Electronically Signed   By: Amie Portland M.D.   On: 04/21/2020 20:52      Assessment and Plan  Retained placenta with hemorrhage - Admit to OBSCU for observation  - Dr. Henderson Cloud assumes  care of patient -- her documentation and orders   Raelyn Mora, CNM 04/22/2020, 12:06 AM

## 2020-04-22 NOTE — Progress Notes (Signed)
Chaplain visited with Lida and her parents to introduce spiritual care and offer support upon the premature birth of her son who did not survive. Chaplain facilitated story telling and helped family to process feelings including anger and grief regarding her attempt to get care at [facility] and then subsequent discharge and later admission to [facility].  Kayah named her anger and being discharged from [facility] and feelings of guilt for not presenting to [facility] initially. Chaplain normalized her feelings and educated about grief processes. Yana did not want to see her baby for fear of holding that in her memory forever, but is grateful for his footprints. She is still thinking of a name for him. Celeste also has an 54 month old son, Marcial Pacas and knows that this loss will make her value his life even more. Mishayla is aware of how to reach spiritual care for continued support after discharge.  Please page as further needs arise.  Maryanna Shape. Carley Hammed, M.Div. [facility] Chaplain Pager [phone] Office [phone]

## 2020-04-23 LAB — SURGICAL PATHOLOGY

## 2020-05-16 ENCOUNTER — Inpatient Hospital Stay (HOSPITAL_COMMUNITY): Payer: Medicaid Other

## 2020-05-16 ENCOUNTER — Other Ambulatory Visit: Payer: Self-pay

## 2020-05-16 ENCOUNTER — Encounter (HOSPITAL_COMMUNITY): Payer: Self-pay | Admitting: Obstetrics and Gynecology

## 2020-05-16 ENCOUNTER — Inpatient Hospital Stay (HOSPITAL_COMMUNITY): Payer: Medicaid Other | Admitting: Certified Registered"

## 2020-05-16 ENCOUNTER — Encounter (HOSPITAL_COMMUNITY)
Admission: AD | Disposition: A | Discharge status: Home / Self Care | Payer: Self-pay | Source: Home / Self Care | Attending: Obstetrics and Gynecology

## 2020-05-16 ENCOUNTER — Observation Stay (HOSPITAL_COMMUNITY)
Admission: AD | Admit: 2020-05-16 | Discharge: 2020-05-17 | Disposition: A | Discharge status: Home / Self Care | Payer: Medicaid Other | Attending: Obstetrics and Gynecology | Admitting: Obstetrics and Gynecology

## 2020-05-16 DIAGNOSIS — Z20822 Contact with and (suspected) exposure to covid-19: Secondary | ICD-10-CM | POA: Diagnosis not present

## 2020-05-16 DIAGNOSIS — O036 Delayed or excessive hemorrhage following complete or unspecified spontaneous abortion: Secondary | ICD-10-CM | POA: Diagnosis not present

## 2020-05-16 DIAGNOSIS — Z9889 Other specified postprocedural states: Secondary | ICD-10-CM

## 2020-05-16 DIAGNOSIS — N939 Abnormal uterine and vaginal bleeding, unspecified: Secondary | ICD-10-CM

## 2020-05-16 DIAGNOSIS — O034 Incomplete spontaneous abortion without complication: Secondary | ICD-10-CM | POA: Diagnosis present

## 2020-05-16 HISTORY — PX: DILATION AND EVACUATION: SHX1459

## 2020-05-16 LAB — COMPREHENSIVE METABOLIC PANEL
ALT: 9 U/L (ref 0–44)
AST: 14 U/L — ABNORMAL LOW (ref 15–41)
Albumin: 3.4 g/dL — ABNORMAL LOW (ref 3.5–5.0)
Alkaline Phosphatase: 57 U/L (ref 38–126)
Anion gap: 5 (ref 5–15)
BUN: <5 mg/dL — ABNORMAL LOW (ref 6–20)
CO2: 26 mmol/L (ref 22–32)
Calcium: 9.1 mg/dL (ref 8.9–10.3)
Chloride: 108 mmol/L (ref 98–111)
Creatinine, Ser: 0.73 mg/dL (ref 0.44–1.00)
GFR, Estimated: >60 mL/min (ref >60)
Glucose, Bld: 100 mg/dL — ABNORMAL HIGH (ref 70–99)
Potassium: 3.9 mmol/L (ref 3.5–5.1)
Sodium: 139 mmol/L (ref 135–145)
Total Bilirubin: 0.5 mg/dL (ref 0.3–1.2)
Total Protein: 6.5 g/dL (ref 6.5–8.1)

## 2020-05-16 LAB — CBC WITH DIFFERENTIAL/PLATELET
Abs Immature Granulocytes: 0.02 10*3/uL (ref 0.00–0.07)
Basophils Absolute: 0.1 10*3/uL (ref 0.0–0.1)
Basophils Relative: 1 %
Eosinophils Absolute: 0.1 10*3/uL (ref 0.0–0.5)
Eosinophils Relative: 2 %
HCT: 26.6 % — ABNORMAL LOW (ref 36.0–46.0)
Hemoglobin: 8.1 g/dL — ABNORMAL LOW (ref 12.0–15.0)
Immature Granulocytes: 0 %
Lymphocytes Relative: 27 %
Lymphs Abs: 1.6 10*3/uL (ref 0.7–4.0)
MCH: 27.4 pg (ref 26.0–34.0)
MCHC: 30.5 g/dL (ref 30.0–36.0)
MCV: 89.9 fL (ref 80.0–100.0)
Monocytes Absolute: 0.6 10*3/uL (ref 0.1–1.0)
Monocytes Relative: 9 %
Neutro Abs: 3.9 10*3/uL (ref 1.7–7.7)
Neutrophils Relative %: 61 %
Platelets: 254 10*3/uL (ref 150–400)
RBC: 2.96 MIL/uL — ABNORMAL LOW (ref 3.87–5.11)
RDW: 13 % (ref 11.5–15.5)
WBC: 6.2 10*3/uL (ref 4.0–10.5)
nRBC: 0 % (ref 0.0–0.2)

## 2020-05-16 LAB — CBC
HCT: 27.5 % — ABNORMAL LOW (ref 36.0–46.0)
Hemoglobin: 9.1 g/dL — ABNORMAL LOW (ref 12.0–15.0)
MCH: 29.1 pg (ref 26.0–34.0)
MCHC: 33.1 g/dL (ref 30.0–36.0)
MCV: 87.9 fL (ref 80.0–100.0)
Platelets: 288 10*3/uL (ref 150–400)
RBC: 3.13 MIL/uL — ABNORMAL LOW (ref 3.87–5.11)
RDW: 13.2 % (ref 11.5–15.5)
WBC: 8.3 10*3/uL (ref 4.0–10.5)
nRBC: 0 % (ref 0.0–0.2)

## 2020-05-16 LAB — RESP PANEL BY RT-PCR (FLU A&B, COVID) ARPGX2
Influenza A by PCR: NEGATIVE
Influenza B by PCR: NEGATIVE
SARS Coronavirus 2 by RT PCR: NEGATIVE

## 2020-05-16 LAB — TYPE AND SCREEN
ABO/RH(D): O POS
Antibody Screen: NEGATIVE

## 2020-05-16 LAB — HCG, QUANTITATIVE, PREGNANCY: hCG, Beta Chain, Quant, S: 37 m[IU]/mL — ABNORMAL HIGH (ref <5)

## 2020-05-16 SURGERY — DILATION AND EVACUATION, UTERUS
Anesthesia: General

## 2020-05-16 MED ORDER — MIDAZOLAM HCL 2 MG/2ML IJ SOLN
INTRAMUSCULAR | Status: AC
  Filled 2020-05-16: qty 2

## 2020-05-16 MED ORDER — MENTHOL 3 MG MT LOZG: 1.0000 | LOZENGE | OROMUCOSAL | Status: DC | PRN

## 2020-05-16 MED ORDER — PROPOFOL 10 MG/ML IV BOLUS
INTRAVENOUS | Status: DC | PRN
  Administered 2020-05-16: 120 mg via INTRAVENOUS

## 2020-05-16 MED ORDER — AMISULPRIDE (ANTIEMETIC) 5 MG/2ML IV SOLN
INTRAVENOUS | Status: AC
  Filled 2020-05-16: qty 4

## 2020-05-16 MED ORDER — FENTANYL CITRATE (PF) 250 MCG/5ML IJ SOLN
INTRAMUSCULAR | Status: DC | PRN
  Administered 2020-05-16: 50 ug via INTRAVENOUS

## 2020-05-16 MED ORDER — OXYCODONE-ACETAMINOPHEN 5-325 MG PO TABS: 1.0000 | ORAL_TABLET | ORAL | Status: DC | PRN

## 2020-05-16 MED ORDER — FENTANYL CITRATE (PF) 100 MCG/2ML IJ SOLN: 25.0000 ug | INTRAMUSCULAR | Status: DC | PRN

## 2020-05-16 MED ORDER — SUCCINYLCHOLINE 20MG/ML (10ML) SYRINGE FOR MEDFUSION PUMP - OPTIME
INTRAMUSCULAR | Status: DC | PRN
  Administered 2020-05-16: 80 mg via INTRAVENOUS

## 2020-05-16 MED ORDER — ONDANSETRON HCL 4 MG/2ML IJ SOLN
INTRAMUSCULAR | Status: DC | PRN
  Administered 2020-05-16: 4 mg via INTRAVENOUS

## 2020-05-16 MED ORDER — 0.9 % SODIUM CHLORIDE (POUR BTL) OPTIME
TOPICAL | Status: DC | PRN
  Administered 2020-05-16: 1000 mL

## 2020-05-16 MED ORDER — METHYLERGONOVINE MALEATE 0.2 MG/ML IJ SOLN
0.2000 mg | Freq: Once | INTRAMUSCULAR | Status: DC
  Filled 2020-05-16: qty 1

## 2020-05-16 MED ORDER — METHYLERGONOVINE MALEATE 0.2 MG/ML IJ SOLN
0.2000 mg | Freq: Once | INTRAMUSCULAR | Status: AC
  Administered 2020-05-16: .2 mg via INTRAMUSCULAR
  Filled 2020-05-16: qty 1

## 2020-05-16 MED ORDER — ONDANSETRON HCL 4 MG/2ML IJ SOLN
INTRAMUSCULAR | Status: AC
  Filled 2020-05-16: qty 2

## 2020-05-16 MED ORDER — MIDAZOLAM HCL 2 MG/2ML IJ SOLN
INTRAMUSCULAR | Status: DC | PRN
  Administered 2020-05-16: 2 mg via INTRAVENOUS

## 2020-05-16 MED ORDER — DEXAMETHASONE SODIUM PHOSPHATE 10 MG/ML IJ SOLN
INTRAMUSCULAR | Status: AC
  Filled 2020-05-16: qty 1

## 2020-05-16 MED ORDER — ONDANSETRON HCL 4 MG PO TABS: 4.0000 mg | ORAL_TABLET | Freq: Four times a day (QID) | ORAL | Status: DC | PRN

## 2020-05-16 MED ORDER — DEXAMETHASONE SODIUM PHOSPHATE 10 MG/ML IJ SOLN
INTRAMUSCULAR | Status: DC | PRN
  Administered 2020-05-16: 10 mg via INTRAVENOUS

## 2020-05-16 MED ORDER — PROPOFOL 10 MG/ML IV BOLUS
INTRAVENOUS | Status: AC
  Filled 2020-05-16: qty 20

## 2020-05-16 MED ORDER — AMISULPRIDE (ANTIEMETIC) 5 MG/2ML IV SOLN
10.0000 mg | Freq: Once | INTRAVENOUS | Status: AC | PRN
  Administered 2020-05-16: 10 mg via INTRAVENOUS

## 2020-05-16 MED ORDER — FENTANYL CITRATE (PF) 250 MCG/5ML IJ SOLN
INTRAMUSCULAR | Status: AC
  Filled 2020-05-16: qty 5

## 2020-05-16 MED ORDER — DEXTROSE IN LACTATED RINGERS 5 % IV SOLN: INTRAVENOUS | Status: DC

## 2020-05-16 MED ORDER — SODIUM CHLORIDE 0.9 % IV SOLN
100.0000 mg | Freq: Once | INTRAVENOUS | Status: AC
  Administered 2020-05-16: 100 mg via INTRAVENOUS
  Filled 2020-05-16 (×2): qty 100

## 2020-05-16 MED ORDER — LACTATED RINGERS IV SOLN: INTRAVENOUS | Status: DC | PRN

## 2020-05-16 MED ORDER — LIDOCAINE HCL (CARDIAC) PF 100 MG/5ML IV SOSY
PREFILLED_SYRINGE | INTRAVENOUS | Status: DC | PRN
  Administered 2020-05-16: 60 mg via INTRAVENOUS

## 2020-05-16 MED ORDER — ONDANSETRON HCL 4 MG/2ML IJ SOLN
4.0000 mg | Freq: Four times a day (QID) | INTRAMUSCULAR | Status: DC | PRN
  Administered 2020-05-16: 4 mg via INTRAVENOUS
  Filled 2020-05-16: qty 2

## 2020-05-16 MED ORDER — IBUPROFEN 800 MG PO TABS
800.0000 mg | ORAL_TABLET | Freq: Three times a day (TID) | ORAL | Status: DC
  Administered 2020-05-16 – 2020-05-17 (×2): 800 mg via ORAL
  Filled 2020-05-16 (×2): qty 1

## 2020-05-16 MED ORDER — SENNA 8.6 MG PO TABS
1.0000 | ORAL_TABLET | Freq: Two times a day (BID) | ORAL | Status: DC
  Administered 2020-05-16 – 2020-05-17 (×2): 8.6 mg via ORAL
  Filled 2020-05-16 (×2): qty 1

## 2020-05-16 MED ORDER — LACTATED RINGERS IV BOLUS
500.0000 mL | Freq: Once | INTRAVENOUS | Status: AC
  Administered 2020-05-16: 500 mL via INTRAVENOUS

## 2020-05-16 SURGICAL SUPPLY — 23 items
CATH ROBINSON RED A/P 16FR (CATHETERS) ×4 IMPLANT
CNTNR URN SCR LID CUP LEK RST (MISCELLANEOUS) ×1 IMPLANT
CONT SPEC 4OZ STRL OR WHT (MISCELLANEOUS) ×1
DECANTER SPIKE VIAL GLASS SM (MISCELLANEOUS) ×2 IMPLANT
FILTER UTR ASPR ASSEMBLY (MISCELLANEOUS) ×2 IMPLANT
GLOVE BIO SURGEON STRL SZ7 (GLOVE) ×4 IMPLANT
GLOVE SURG UNDER POLY LF SZ7 (GLOVE) ×4 IMPLANT
GOWN STRL REUS W/ TWL LRG LVL3 (GOWN DISPOSABLE) ×4 IMPLANT
GOWN STRL REUS W/TWL LRG LVL3 (GOWN DISPOSABLE) ×4
HOSE CONNECTING 18IN BERKELEY (TUBING) ×2 IMPLANT
KIT BERKELEY 1ST TRI 3/8 NO TR (MISCELLANEOUS) ×2 IMPLANT
KIT BERKELEY 1ST TRIMESTER 3/8 (MISCELLANEOUS) ×2 IMPLANT
KIT TURNOVER KIT B (KITS) ×2 IMPLANT
NS IRRIG 1000ML POUR BTL (IV SOLUTION) ×2 IMPLANT
PACK VAGINAL MINOR WOMEN LF (CUSTOM PROCEDURE TRAY) ×4 IMPLANT
PAD OB MATERNITY 4.3X12.25 (PERSONAL CARE ITEMS) ×4 IMPLANT
SET BERKELEY SUCTION TUBING (SUCTIONS) ×2 IMPLANT
TOWEL GREEN STERILE FF (TOWEL DISPOSABLE) ×8 IMPLANT
UNDERPAD 30X36 HEAVY ABSORB (UNDERPADS AND DIAPERS) ×4 IMPLANT
VACURETTE 10 RIGID CVD (CANNULA) IMPLANT
VACURETTE 7MM CVD STRL WRAP (CANNULA) IMPLANT
VACURETTE 8 RIGID CVD (CANNULA) IMPLANT
VACURETTE 9 RIGID CVD (CANNULA) ×2 IMPLANT

## 2020-05-16 NOTE — H&P (Signed)
Please see detailed note from MAU Doc below.    Briefly, this is a 25 y.o. 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  Placenta delivery was delayed but did come out without manipulation.  Since that time she has had bleeding that got better but suddenly got worse with clots today.  Bleeding is stable now.    US   Uterus  Measurements: 8.0 x 5.2 x 6.3 cm = volume: 136 mL. Retroverted uterus is mildly enlarged. No uterine fibroids or other myometrial abnormalities.  Endometrium  Thickness: 11 mm. The margins of the endometrium are poorly delineated. There is apparent mild thickening and heterogeneity of the endometrium, with no discrete endometrial mass. There may be mild vascularity present at the margins of the endometrium on color Doppler.  Right ovary  Measurements: 2.8 x 2.2 x 2.7 cm = volume: 8.7 mL. Normal appearance/no adnexal mass.  Left ovary  Measurements: 3.3 x 1.6 x 2.4 cm = volume: 6.6 mL. Normal appearance/no adnexal mass.  Other findings  No abnormal free fluid.  IMPRESSION: 1. Poorly delineated endometrial margins. Apparent mild thickening (11 mm) and heterogeneity of the endometrium, with no discrete endometrial mass. Retained products of conception cannot be excluded on the basis of this scan. MRI pelvis without and with IV contrast may be considered for further evaluation, as clinically warranted. 2. Normal ovaries.  No adnexal masses.  Results for orders placed or performed during the hospital encounter of 05/16/20 (from the past 24 hour(s))  CBC     Status: Abnormal   Collection Time: 05/16/20  1:03 PM  Result Value Ref Range   WBC 8.3 4.0 - 10.5 K/uL   RBC 3.13 (L) 3.87 - 5.11 MIL/uL   Hemoglobin 9.1 (L) 12.0 - 15.0 g/dL   HCT 69.6 (L) 29.5 - 28.4 %   MCV 87.9 80.0 - 100.0 fL   MCH 29.1 26.0 - 34.0 pg   MCHC 33.1 30.0 - 36.0 g/dL   RDW 13.2 44.0 - 10.2 %   Platelets 288 150 - 400  K/uL   nRBC 0.0 0.0 - 0.2 %  Comprehensive metabolic panel     Status: Abnormal   Collection Time: 05/16/20  1:03 PM  Result Value Ref Range   Sodium 139 135 - 145 mmol/L   Potassium 3.9 3.5 - 5.1 mmol/L   Chloride 108 98 - 111 mmol/L   CO2 26 22 - 32 mmol/L   Glucose, Bld 100 (H) 70 - 99 mg/dL   BUN <5 (L) 6 - 20 mg/dL   Creatinine, Ser 7.25 0.44 - 1.00 mg/dL   Calcium 9.1 8.9 - 36.6 mg/dL   Total Protein 6.5 6.5 - 8.1 g/dL   Albumin 3.4 (L) 3.5 - 5.0 g/dL   AST 14 (L) 15 - 41 U/L   ALT 9 0 - 44 U/L   Alkaline Phosphatase 57 38 - 126 U/L   Total Bilirubin 0.5 0.3 - 1.2 mg/dL   GFR, Estimated >44 >03 mL/min   Anion gap 5 5 - 15  hCG, quantitative, pregnancy     Status: Abnormal   Collection Time: 05/16/20  1:03 PM  Result Value Ref Range   hCG, Beta Chain, Quant, S 37 (H) <5 mIU/mL    A/P Possible retained products.  Pt last ate at 0900 today and she is consented for D&E for possible retained products.   Type and screen pending but pt is O+.  Iron post op for  mild anemia.   Per MAU DOC:   HPI Christie Hart is a 25 y.o. s/p miscarriage at 17w on 04/22/2020, who presents for ongoing vaginal bleeding.   Review of discharge summary from last admission on 04/22/2020: Patient presented with cramping and ROM and quickly delivered preterm previable fetus in breech position at [redacted]w[redacted]d. There was delayed delivery of the placenta >1hr, subsequently resolved with some pushing. Discharged about 12h later.   Patient reports that since her miscarriage bleeding had initially gotten less heavy and had almost resolved a few days ago She subsequently got heavier bleeding over the past few days and then today woke up with her sheets soaked and came to MAU for evaluation She last had unprotected sex around 5 days ago, that was the only time since delivery, she is not on any form of birth control She has been passing clots that are about quarter sized Periods prior to pregnancy were light  and without clots Denies dizziness, lightheadedness, chest pain, SOB Denies abdominal pain  --/--/O POS Performed at [facility], [address] [place], [place] [address]  (02/13 2003)          OB History    Gravida  2   Para  1   Term  1   Preterm      AB  1   Living  1     SAB  1   IAB      Ectopic      Multiple  0   Live Births  1               Past Medical History:  Diagnosis Date  . ADHD (attention deficit hyperactivity disorder)   . Bipolar affective disorder (HCC)   . Depression   . Hx of chlamydia infection   . Sleep difficulties          Past Surgical History:  Procedure Laterality Date  . NO PAST SURGERIES      Family History  Problem Relation Age of Onset  . Diabetes Mother   . Hypertension Mother     Social History        Socioeconomic History  . Marital status: Single    Spouse name: Not on file  . Number of children: Not on file  . Years of education: Not on file  . Highest education level: Not on file  Occupational History  . Not on file  Tobacco Use  . Smoking status: Never Smoker  . Smokeless tobacco: Never Used  Vaping Use  . Vaping Use: Never used  Substance and Sexual Activity  . Alcohol use: No  . Drug use: No  . Sexual activity: Not on file  Other Topics Concern  . Not on file  Social History Narrative  . Not on file   Social Determinants of Health   Financial Resource Strain: Not on file  Food Insecurity: Not on file  Transportation Needs: Not on file  Physical Activity: Not on file  Stress: Not on file  Social Connections: Not on file  Intimate Partner Violence: Not on file    No Known Allergies  No current facility-administered medications on file prior to encounter.         Current Outpatient Medications on File Prior to Encounter  Medication Sig Dispense Refill  . Prenatal Vit-Fe Fumarate-FA (PRENATAL MULTIVITAMIN) TABS tablet Take 1  tablet by mouth daily at 12 noon.       ROS Pertinent positives and  negative per HPI, all others reviewed and negative  Physical Exam   BP (!) 109/58   Pulse 91   Temp 98.7 F (37.1 C)   Resp 18   Ht 5\' 8"  (1.727 m)   Wt 62.6 kg   LMP 10/13/2019   BMI 20.98 kg/m   Patient Vitals for the past 24 hrs:  BP Temp Pulse Resp Height Weight  05/16/20 1228 (!) 109/58 98.7 F (37.1 C) 91 18 5\' 8"  (1.727 m) 62.6 kg    Physical Exam Vitals reviewed.  Constitutional:      General: She is not in acute distress.    Appearance: She is well-developed. She is not diaphoretic.  Eyes:     General: No scleral icterus. Pulmonary:     Effort: Pulmonary effort is normal. No respiratory distress.  Abdominal:     General: There is no distension.     Palpations: Abdomen is soft.     Tenderness: There is no abdominal tenderness. There is no guarding or rebound.  Genitourinary:    Comments: Normal external and internal genitalia, small amount of blood in vault Skin:    General: Skin is warm and dry.  Neurological:     Mental Status: She is alert.     Coordination: Coordination normal.      Cervical Exam  Bedside Ultrasound Not done  My interpretation: n/a  FHT N/a  Labs Lab Results Last 24 Hours       Results for orders placed or performed during the hospital encounter of 05/16/20 (from the past 24 hour(s))  CBC     Status: Abnormal   Collection Time: 05/16/20  1:03 PM  Result Value Ref Range   WBC 8.3 4.0 - 10.5 K/uL   RBC 3.13 (L) 3.87 - 5.11 MIL/uL   Hemoglobin 9.1 (L) 12.0 - 15.0 g/dL   HCT 07/16/20 (L) 07/16/20 - 05.3 %   MCV 87.9 80.0 - 100.0 fL   MCH 29.1 26.0 - 34.0 pg   MCHC 33.1 30.0 - 36.0 g/dL   RDW 97.6 73.4 - 19.3 %   Platelets 288 150 - 400 K/uL   nRBC 0.0 0.0 - 0.2 %      Imaging No results found.  MAU Course  Procedures  Lab Orders     CBC     Comprehensive metabolic panel     hCG, quantitative, pregnancy No orders of  the defined types were placed in this encounter.   Imaging Orders     79.0 PELVIC COMPLETE WITH TRANSVAGINAL  MDM moderate  Assessment and Plan  #Vaginal bleeding after miscarriage Patient with decrease in bleeding followed by sudden increase with clots. Suspicious for retained POC given hx of retained placenta at time of delivery, though could be related to hormonal cycle post delivery. Will obtain basic labs and 24.0 to evaluate.  1:53 PM

## 2020-05-16 NOTE — Anesthesia Postprocedure Evaluation (Signed)
Anesthesia Post Note  Patient: Christie Hart  Procedure(s) Performed: DILATATION AND EVACUATION (N/A )     Patient location during evaluation: PACU Anesthesia Type: General Level of consciousness: awake and alert Pain management: pain level controlled Vital Signs Assessment: post-procedure vital signs reviewed and stable Respiratory status: spontaneous breathing, nonlabored ventilation, respiratory function stable and patient connected to nasal cannula oxygen Cardiovascular status: blood pressure returned to baseline and stable Postop Assessment: no apparent nausea or vomiting Anesthetic complications: no   No complications documented.  Last Vitals:  Vitals:   05/16/20 2236 05/16/20 2340  BP: (!) 110/57 (!) 111/57  Pulse: 64 63  Resp: 16 16  Temp: 36.9 C 36.4 C  SpO2: 100% 98%    Last Pain:  Vitals:   05/16/20 2340  TempSrc: Oral  PainSc: 8                  Kennieth Rad

## 2020-05-16 NOTE — Transfer of Care (Signed)
Immediate Anesthesia Transfer of Care Note  Patient: Christie Hart  Procedure(s) Performed: DILATATION AND EVACUATION (N/A )  Patient Location: PACU  Anesthesia Type:General  Level of Consciousness: awake and sedated  Airway & Oxygen Therapy: Patient Spontanous Breathing  Post-op Assessment: Report given to RN and Post -op Vital signs reviewed and stable  Post vital signs: Reviewed and stable  Last Vitals:  Vitals Value Taken Time  BP 113/67 05/16/20 2117  Temp 36.5 C 05/16/20 2117  Pulse 70 05/16/20 2120  Resp 16 05/16/20 2120  SpO2 100 % 05/16/20 2120  Vitals shown include unvalidated device data.  Last Pain:  Vitals:   05/16/20 1858  TempSrc: Oral         Complications: No complications documented.

## 2020-05-16 NOTE — MAU Note (Signed)
Pt stated she had a miscarriage last month (2/14). Has had bleeding this whole time. Had heavy bleeding 4 days ago. Then it stopped and woke up today and her underwear was soaked with blood. Has passed some small clots. Denies any cramping.

## 2020-05-16 NOTE — MAU Note (Signed)
Pt awoken for vital signs. BP cuff on upper arm with pt lying on her side. Pt denies feeling light headed or dizzy.

## 2020-05-16 NOTE — Anesthesia Preprocedure Evaluation (Addendum)
Anesthesia Evaluation  Patient identified by MRN, date of birth, ID band Patient awake    Reviewed: Allergy & Precautions, NPO status , Patient's Chart, lab work & pertinent test results  Airway Mallampati: II  TM Distance: >3 FB Neck ROM: Full    Dental  (+) Dental Advisory Given   Pulmonary neg pulmonary ROS,    breath sounds clear to auscultation       Cardiovascular negative cardio ROS   Rhythm:Regular Rate:Normal     Neuro/Psych negative neurological ROS     GI/Hepatic negative GI ROS, Neg liver ROS,   Endo/Other  negative endocrine ROS  Renal/GU negative Renal ROS     Musculoskeletal   Abdominal   Peds  Hematology  (+) anemia ,   Anesthesia Other Findings   Reproductive/Obstetrics                             Anesthesia Physical Anesthesia Plan  ASA: III and emergent  Anesthesia Plan: General   Post-op Pain Management:    Induction: Intravenous and Rapid sequence  PONV Risk Score and Plan: 3 and Ondansetron, Dexamethasone, Treatment may vary due to age or medical condition and Midazolam  Airway Management Planned: Oral ETT  Additional Equipment:   Intra-op Plan:   Post-operative Plan: Extubation in OR  Informed Consent: I have reviewed the patients History and Physical, chart, labs and discussed the procedure including the risks, benefits and alternatives for the proposed anesthesia with the patient or authorized representative who has indicated his/her understanding and acceptance.     Dental advisory given  Plan Discussed with: CRNA  Anesthesia Plan Comments:         Anesthesia Quick Evaluation

## 2020-05-16 NOTE — Brief Op Note (Signed)
05/16/2020  9:00 PM  PATIENT:  Rhett Bannister Bice  25 y.o. female  PRE-OPERATIVE DIAGNOSIS:  Retained POC  POST-OPERATIVE DIAGNOSIS:  Retained POC  PROCEDURE:  Procedure(s): DILATATION AND EVACUATION (N/A)  SURGEON:  Surgeon(s) and Role:    * Carrington Clamp, MD - Primary  PHYSICIAN ASSISTANT:   ASSISTANTS: none   ANESTHESIA:   general  EBL:  See anethesia record, normal amount   LOCAL MEDICATIONS USED:  NONE  SPECIMEN:  Source of Specimen:  uterine currettings  DISPOSITION OF SPECIMEN:  PATHOLOGY  COUNTS:  YES  TOURNIQUET:  * No tourniquets in log *  DICTATION: .Note written in EPIC  PLAN OF CARE: Admit for overnight observation  PATIENT DISPOSITION:  PACU - hemodynamically stable.   Delay start of Pharmacological VTE agent (>24hrs) due to surgical blood loss or risk of bleeding: not applicable

## 2020-05-16 NOTE — Anesthesia Procedure Notes (Signed)
Procedure Name: Intubation Date/Time: 05/16/2020 8:37 PM Performed by: Molli Hazard, CRNA Pre-anesthesia Checklist: Patient identified, Emergency Drugs available, Suction available and Patient being monitored Patient Re-evaluated:Patient Re-evaluated prior to induction Oxygen Delivery Method: Circle system utilized Preoxygenation: Pre-oxygenation with 100% oxygen Induction Type: IV induction, Rapid sequence and Cricoid Pressure applied Laryngoscope Size: Miller and 2 Grade View: Grade I Tube type: Oral Tube size: 7.0 mm Number of attempts: 1 Airway Equipment and Method: Stylet Placement Confirmation: ETT inserted through vocal cords under direct vision,  positive ETCO2 and breath sounds checked- equal and bilateral Secured at: 22 cm Tube secured with: Tape Dental Injury: Teeth and Oropharynx as per pre-operative assessment

## 2020-05-16 NOTE — Progress Notes (Signed)
Pt stable.  Vitals stable although BP on low side.  H/H now 8.1/26.6   Will keep overnight post op for am CBC check and d/c if stable then.

## 2020-05-16 NOTE — Op Note (Signed)
05/16/2020  9:00 PM  PATIENT:  Christie Hart  25 y.o. female  PRE-OPERATIVE DIAGNOSIS:  Retained POC  POST-OPERATIVE DIAGNOSIS:  Retained POC  PROCEDURE:  Procedure(s): DILATATION AND EVACUATION (N/A)  SURGEON:  Surgeon(s) and Role:    * Carrington Clamp, MD - Primary  PHYSICIAN ASSISTANT:   ASSISTANTS: none   ANESTHESIA:   general  EBL:  See anethesia record, normal amount   LOCAL MEDICATIONS USED:  NONE  SPECIMEN:  Source of Specimen:  uterine currettings  DISPOSITION OF SPECIMEN:  PATHOLOGY  COUNTS:  YES  TOURNIQUET:  * No tourniquets in log *  DICTATION: .Note written in EPIC  PLAN OF CARE: Admit for overnight observation  PATIENT DISPOSITION:  PACU - hemodynamically stable.   Delay start of Pharmacological VTE agent (>24hrs) due to surgical blood loss or risk of bleeding: not applicable  Medications: Methergine, Doxycyline  Complications: None  Findings:  12 week size uterus to 10 size post procedure.  Good crie was achieved.  After adequate anesthesia was achieved, the patient was prepped and draped in the usual sterile fashion.  The speculum was placed in the vagina and the cervix stabilized with a single-tooth tenaculum.  The cervix was dilated with Shawnie Pons dilators and the 9 mm curette was used to remove contents of the uterus.  Alternating sharp curettage with a curette and suction curettage was performed until all contents were removed and good crie was achieved.  All instruments were removed from the vagina.  The patient tolerated the procedure well.    HORVATH,MICHELLE A

## 2020-05-16 NOTE — MAU Provider Note (Signed)
History     [id number]  Arrival date and time: 05/16/20 1159    Chief Complaint  Patient presents with   Vaginal Bleeding     HPI Christie Hart is a 25 y.o. s/p miscarriage at 17w on 04/22/2020, who presents for ongoing vaginal bleeding.   Review of discharge summary from last admission on 04/22/2020: Patient presented with cramping and ROM and quickly delivered preterm previable fetus in breech position at [redacted]w[redacted]d. There was delayed delivery of the placenta >1hr, subsequently resolved with some pushing. Discharged about 12h later.   Patient reports that since her miscarriage bleeding had initially gotten less heavy and had almost resolved a few days ago She subsequently got heavier bleeding over the past few days and then today woke up with her sheets soaked and came to MAU for evaluation She last had unprotected sex around 5 days ago, that was the only time since delivery, she is not on any form of birth control She has been passing clots that are about quarter sized Periods prior to pregnancy were light and without clots Denies dizziness, lightheadedness, chest pain, SOB Denies abdominal pain  --/--/O POS Performed at [facility], [address], [place], [place] [address]  (02/13 2003)  OB History    Gravida  2   Para  1   Term  1   Preterm      AB  1   Living  1     SAB  1   IAB      Ectopic      Multiple  0   Live Births  1           Past Medical History:  Diagnosis Date   ADHD (attention deficit hyperactivity disorder)    Bipolar affective disorder (HCC)    Depression    Hx of chlamydia infection    Sleep difficulties     Past Surgical History:  Procedure Laterality Date   NO PAST SURGERIES      Family History  Problem Relation Age of Onset   Diabetes Mother    Hypertension Mother     Social History   Socioeconomic History   Marital status: Single    Spouse name: Not on file   Number of  children: Not on file   Years of education: Not on file   Highest education level: Not on file  Occupational History   Not on file  Tobacco Use   Smoking status: Never Smoker   Smokeless tobacco: Never Used  Vaping Use   Vaping Use: Never used  Substance and Sexual Activity   Alcohol use: No   Drug use: No   Sexual activity: Not on file  Other Topics Concern   Not on file  Social History Narrative   Not on file   Social Determinants of Health   Financial Resource Strain: Not on file  Food Insecurity: Not on file  Transportation Needs: Not on file  Physical Activity: Not on file  Stress: Not on file  Social Connections: Not on file  Intimate Partner Violence: Not on file    No Known Allergies  No current facility-administered medications on file prior to encounter.   Current Outpatient Medications on File Prior to Encounter  Medication Sig Dispense Refill   Prenatal Vit-Fe Fumarate-FA (PRENATAL MULTIVITAMIN) TABS tablet Take 1 tablet by mouth daily at 12 noon.       ROS Pertinent positives and negative per HPI, all others reviewed and negative  Physical Exam   BP (!) 109/58    Pulse 91    Temp 98.7 F (37.1 C)    Resp 18    Ht 5\' 8"  (1.727 m)    Wt 62.6 kg    LMP 10/13/2019    BMI 20.98 kg/m   Patient Vitals for the past 24 hrs:  BP Temp Pulse Resp Height Weight  05/16/20 1228 (!) 109/58 98.7 F (37.1 C) 91 18 5\' 8"  (1.727 m) 62.6 kg    Physical Exam Vitals reviewed.  Constitutional:      General: She is not in acute distress.    Appearance: She is well-developed. She is not diaphoretic.  Eyes:     General: No scleral icterus. Pulmonary:     Effort: Pulmonary effort is normal. No respiratory distress.  Abdominal:     General: There is no distension.     Palpations: Abdomen is soft.     Tenderness: There is no abdominal tenderness. There is no guarding or rebound.  Genitourinary:    Comments: Normal external and internal genitalia, small  amount of blood in vault Skin:    General: Skin is warm and dry.  Neurological:     Mental Status: She is alert.     Coordination: Coordination normal.      Cervical Exam    Bedside Ultrasound Not done  My interpretation: n/a  FHT N/a  Labs Results for orders placed or performed during the hospital encounter of 05/16/20 (from the past 24 hour(s))  CBC     Status: Abnormal   Collection Time: 05/16/20  1:03 PM  Result Value Ref Range   WBC 8.3 4.0 - 10.5 K/uL   RBC 3.13 (L) 3.87 - 5.11 MIL/uL   Hemoglobin 9.1 (L) 12.0 - 15.0 g/dL   HCT 07/16/20 (L) 07/16/20 - 06.3 %   MCV 87.9 80.0 - 100.0 fL   MCH 29.1 26.0 - 34.0 pg   MCHC 33.1 30.0 - 36.0 g/dL   RDW 01.6 01.0 - 93.2 %   Platelets 288 150 - 400 K/uL   nRBC 0.0 0.0 - 0.2 %  Comprehensive metabolic panel     Status: Abnormal   Collection Time: 05/16/20  1:03 PM  Result Value Ref Range   Sodium 139 135 - 145 mmol/L   Potassium 3.9 3.5 - 5.1 mmol/L   Chloride 108 98 - 111 mmol/L   CO2 26 22 - 32 mmol/L   Glucose, Bld 100 (H) 70 - 99 mg/dL   BUN <5 (L) 6 - 20 mg/dL   Creatinine, Ser 73.2 0.44 - 1.00 mg/dL   Calcium 9.1 8.9 - 07/16/20 mg/dL   Total Protein 6.5 6.5 - 8.1 g/dL   Albumin 3.4 (L) 3.5 - 5.0 g/dL   AST 14 (L) 15 - 41 U/L   ALT 9 0 - 44 U/L   Alkaline Phosphatase 57 38 - 126 U/L   Total Bilirubin 0.5 0.3 - 1.2 mg/dL   GFR, Estimated 2.02 54.2 mL/min   Anion gap 5 5 - 15  hCG, quantitative, pregnancy     Status: Abnormal   Collection Time: 05/16/20  1:03 PM  Result Value Ref Range   hCG, Beta Chain, Quant, S 37 (H) <5 mIU/mL    Imaging >62 PELVIC COMPLETE WITH TRANSVAGINAL  Result Date: 05/16/2020 CLINICAL DATA:  25 year old female with persistent vaginal bleeding since miscarriage on 04/22/2020. EXAM: TRANSABDOMINAL AND TRANSVAGINAL ULTRASOUND OF PELVIS TECHNIQUE: Both transabdominal and transvaginal ultrasound examinations of the pelvis were  performed. Transabdominal technique was performed for global imaging of  the pelvis including uterus, ovaries, adnexal regions, and pelvic cul-de-sac. It was necessary to proceed with endovaginal exam following the transabdominal exam to visualize the endometrium and adnexa. COMPARISON:  04/21/2020 obstetric scan. FINDINGS: Uterus Measurements: 8.0 x 5.2 x 6.3 cm = volume: 136 mL. Retroverted uterus is mildly enlarged. No uterine fibroids or other myometrial abnormalities. Endometrium Thickness: 11 mm. The margins of the endometrium are poorly delineated. There is apparent mild thickening and heterogeneity of the endometrium, with no discrete endometrial mass. There may be mild vascularity present at the margins of the endometrium on color Doppler. Right ovary Measurements: 2.8 x 2.2 x 2.7 cm = volume: 8.7 mL. Normal appearance/no adnexal mass. Left ovary Measurements: 3.3 x 1.6 x 2.4 cm = volume: 6.6 mL. Normal appearance/no adnexal mass. Other findings No abnormal free fluid. IMPRESSION: 1. Poorly delineated endometrial margins. Apparent mild thickening (11 mm) and heterogeneity of the endometrium, with no discrete endometrial mass. Retained products of conception cannot be excluded on the basis of this scan. MRI pelvis without and with IV contrast may be considered for further evaluation, as clinically warranted. 2. Normal ovaries.  No adnexal masses. Electronically Signed   By: Delbert Phenix M.D.   On: 05/16/2020 13:57    MAU Course  Procedures  Lab Orders     Resp Panel by RT-PCR (Flu A&B, Covid) Nasopharyngeal Swab     CBC     Comprehensive metabolic panel     hCG, quantitative, pregnancy No orders of the defined types were placed in this encounter.   Imaging Orders     US PELVIC COMPLETE WITH TRANSVAGINAL  MDM moderate  Assessment and Plan  #Vaginal bleeding after miscarriage Patient with decrease in bleeding followed by sudden increase with clots. Suspicious for retained POC given hx of retained placenta at time of delivery, though could be related to  hormonal cycle post delivery. Will obtain basic labs and [place] to evaluate.  1:53 PM  [place] consistent with retained products, patient remains hemodynamically stable. Discussed w Dr. Henderson Cloud, she will assume care and plan for [facility] for next available add on case. Patient NPO since between 8-9 AM.   2:46 PM  Venora Maples, MD/MPH 05/16/20 2:45 PM

## 2020-05-17 ENCOUNTER — Encounter (HOSPITAL_COMMUNITY): Payer: Self-pay | Admitting: Obstetrics and Gynecology

## 2020-05-17 DIAGNOSIS — O034 Incomplete spontaneous abortion without complication: Secondary | ICD-10-CM | POA: Diagnosis present

## 2020-05-17 LAB — CBC
HCT: 25.8 % — ABNORMAL LOW (ref 36.0–46.0)
Hemoglobin: 8.5 g/dL — ABNORMAL LOW (ref 12.0–15.0)
MCH: 28.7 pg (ref 26.0–34.0)
MCHC: 32.9 g/dL (ref 30.0–36.0)
MCV: 87.2 fL (ref 80.0–100.0)
Platelets: 294 10*3/uL (ref 150–400)
RBC: 2.96 MIL/uL — ABNORMAL LOW (ref 3.87–5.11)
RDW: 12.9 % (ref 11.5–15.5)
WBC: 7.9 10*3/uL (ref 4.0–10.5)
nRBC: 0 % (ref 0.0–0.2)

## 2020-05-17 MED ORDER — IBUPROFEN 800 MG PO TABS: 800.0000 mg | ORAL_TABLET | Freq: Three times a day (TID) | ORAL | 0 refills | Status: DC

## 2020-05-17 NOTE — Discharge Summary (Signed)
Physician Discharge Summary  Patient ID: Christie Hart MRN: [id number] DOB/AGE: Oct 03, 1995 25 y.o.  Admit date: 05/16/2020 Discharge date: 05/17/2020  Admission Diagnoses: retained products of conception   Discharge Diagnoses:  Active Problems:   Postoperative state   Retained products of conception after miscarriage   Discharged Condition: stable  Hospital Course: 25 y.o. 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  Placenta delivery was delayed but did come out without manipulation.  Since that time she has had bleeding that got better but suddenly got worse with clots today.  Ultrasound was concerning for retained products of conception.    Consults: None  Treatments: surgery: Patient was taken to the OR for D&C by Dr. Henderson Cloud for retained products of conception.  Due to concern for ABLA, she was observed overnight.  On POD#1, vita signs were stable.  Vaginal bleeding was scant.  She was ambulating without difficulty and meeting all goals for discharge to home  Discharge Exam: Blood pressure (!) 106/45, pulse 63, temperature 97.9 F (36.6 C), temperature source Oral, resp. rate 16, height 5\' 8"  (1.727 m), weight 62.6 kg, last menstrual period 10/13/2019, SpO2 100 %, unknown if currently breastfeeding. General appearance: alert, cooperative and appears stated age Head: Normocephalic, without obvious abnormality, atraumatic  Abd: soft, non-tender  Disposition: Discharge disposition: 01-Home or Self Care       Discharge Instructions    Call MD for:  difficulty breathing, headache or visual disturbances   Complete by: As directed    Call MD for:  extreme fatigue   Complete by: As directed    Call MD for:  hives   Complete by: As directed    Call MD for:  persistant dizziness or light-headedness   Complete by: As directed    Call MD for:  persistant nausea and vomiting   Complete by: As directed    Call MD for:   redness, tenderness, or signs of infection (pain, swelling, redness, odor or green/yellow discharge around incision site)   Complete by: As directed    Call MD for:  severe uncontrolled pain   Complete by: As directed    Call MD for:  temperature >100.4   Complete by: As directed    Sexual activity   Complete by: As directed    Nothing in vagina x 2 weeks (no tampons, no sex)     Allergies as of 05/17/2020   No Known Allergies     Medication List    TAKE these medications   ibuprofen 800 MG tablet Commonly known as: ADVIL Take 1 tablet (800 mg total) by mouth every 8 (eight) hours.   prenatal multivitamin Tabs tablet Take 1 tablet by mouth daily at 12 noon.       Follow-up Information    07/17/2020, MD Follow up on 05/23/2020.   Specialty: Obstetrics and Gynecology Why: keep scheduled appointment Contact information: [address] [place] [phone]               Signed: [phone] GEFFEL Christie Hart 05/17/2020, 10:03 AM

## 2020-05-17 NOTE — Progress Notes (Signed)
Pt discharged to home with boyfriend.  Condition stable.  Pt ambulated to car with RN.  No equipment for home ordered at discharge. 

## 2020-05-17 NOTE — Progress Notes (Signed)
Patient seen and examined.  Doing well this AM.  Ambulating without difficulty.  Bleeding is scant since surgery.  Denies feeling dizzy or short of breath.  Hemoglobin yesterday fell from 9.1 to 8.1.  This morning, has equilibrated at 8.5   BP (!) 106/45 (BP Location: Left Arm)   Pulse 63   Temp 97.9 F (36.6 C) (Oral)   Resp 16   Ht 5\' 8"  (1.727 m)   Wt 62.6 kg   LMP 10/13/2019   SpO2 100%   BMI 20.98 kg/m  NAD Abd: soft, non-tender  Lab Results  Component Value Date   WBC 7.9 05/17/2020   HGB 8.5 (L) 05/17/2020   HCT 25.8 (L) 05/17/2020   MCV 87.2 05/17/2020   PLT 294 05/17/2020   POD#1 s/p D&C for retained placenta following 17 week loss ABLA--asymptomatic today.  Recommend PO iron on discharge Will d/c to home, keep f/u on 3/17 with Dr. 4/17 as scheduled.

## 2020-05-17 NOTE — Discharge Instructions (Signed)
Please take over the counter iron (ferrous sulfate) 3-4x/ week for the next two months.  You may consider a slow release iron (Slow Fe) as this is better tolerated by most patients.

## 2020-05-20 LAB — SURGICAL PATHOLOGY

## 2020-11-26 LAB — OB RESULTS CONSOLE GC/CHLAMYDIA
Chlamydia: NEGATIVE
Gonorrhea: NEGATIVE

## 2020-12-24 LAB — OB RESULTS CONSOLE RPR: RPR: NONREACTIVE

## 2020-12-24 LAB — OB RESULTS CONSOLE RUBELLA ANTIBODY, IGM: Rubella: IMMUNE

## 2020-12-24 LAB — OB RESULTS CONSOLE ANTIBODY SCREEN: Antibody Screen: NEGATIVE

## 2020-12-24 LAB — OB RESULTS CONSOLE HEPATITIS B SURFACE ANTIGEN: Hepatitis B Surface Ag: NEGATIVE

## 2020-12-24 LAB — HEPATITIS C ANTIBODY: HCV Ab: NEGATIVE

## 2020-12-24 LAB — OB RESULTS CONSOLE HIV ANTIBODY (ROUTINE TESTING): HIV: NONREACTIVE

## 2021-01-14 ENCOUNTER — Inpatient Hospital Stay (HOSPITAL_COMMUNITY)
Admission: EM | Admit: 2021-01-14 | Discharge: 2021-01-15 | Disposition: A | Discharge status: Home / Self Care | Payer: Medicaid Other | Attending: Obstetrics and Gynecology | Admitting: Obstetrics and Gynecology

## 2021-01-14 ENCOUNTER — Other Ambulatory Visit: Payer: Self-pay

## 2021-01-14 DIAGNOSIS — O4592 Premature separation of placenta, unspecified, second trimester: Secondary | ICD-10-CM | POA: Insufficient documentation

## 2021-01-14 DIAGNOSIS — O26892 Other specified pregnancy related conditions, second trimester: Secondary | ICD-10-CM | POA: Insufficient documentation

## 2021-01-14 DIAGNOSIS — R109 Unspecified abdominal pain: Secondary | ICD-10-CM

## 2021-01-14 DIAGNOSIS — Z3A14 14 weeks gestation of pregnancy: Secondary | ICD-10-CM | POA: Insufficient documentation

## 2021-01-14 DIAGNOSIS — R1030 Lower abdominal pain, unspecified: Secondary | ICD-10-CM | POA: Insufficient documentation

## 2021-01-15 ENCOUNTER — Encounter (HOSPITAL_COMMUNITY): Payer: Self-pay | Admitting: Emergency Medicine

## 2021-01-15 ENCOUNTER — Inpatient Hospital Stay (HOSPITAL_BASED_OUTPATIENT_CLINIC_OR_DEPARTMENT_OTHER): Payer: Medicaid Other

## 2021-01-15 ENCOUNTER — Other Ambulatory Visit: Payer: Self-pay

## 2021-01-15 DIAGNOSIS — O09292 Supervision of pregnancy with other poor reproductive or obstetric history, second trimester: Secondary | ICD-10-CM

## 2021-01-15 DIAGNOSIS — R109 Unspecified abdominal pain: Secondary | ICD-10-CM

## 2021-01-15 DIAGNOSIS — Z3A14 14 weeks gestation of pregnancy: Secondary | ICD-10-CM | POA: Diagnosis not present

## 2021-01-15 DIAGNOSIS — O4592 Premature separation of placenta, unspecified, second trimester: Secondary | ICD-10-CM | POA: Diagnosis present

## 2021-01-15 DIAGNOSIS — O26892 Other specified pregnancy related conditions, second trimester: Secondary | ICD-10-CM

## 2021-01-15 DIAGNOSIS — R1030 Lower abdominal pain, unspecified: Secondary | ICD-10-CM | POA: Diagnosis not present

## 2021-01-15 LAB — WET PREP, GENITAL
Clue Cells Wet Prep HPF POC: NONE SEEN
Sperm: NONE SEEN
Trich, Wet Prep: NONE SEEN
Yeast Wet Prep HPF POC: NONE SEEN

## 2021-01-15 LAB — URINALYSIS, ROUTINE W REFLEX MICROSCOPIC
Bilirubin Urine: NEGATIVE
Glucose, UA: NEGATIVE mg/dL
Hgb urine dipstick: NEGATIVE
Ketones, ur: NEGATIVE mg/dL
Nitrite: NEGATIVE
Protein, ur: NEGATIVE mg/dL
Specific Gravity, Urine: 1.009 (ref 1.005–1.030)
pH: 6 (ref 5.0–8.0)

## 2021-01-15 NOTE — ED Provider Notes (Signed)
Emergency Medicine Provider OB Triage Evaluation Note  Christie Hart is a 25 y.o. female, G3P1011, at Unknown gestation who presents to the emergency department with complaints of pelvic cramping and vaginal discharge.  She states pelvic cramping started hour ago, states it is in her right lower quadrant does not radiate, states is constant.  She has been having some vaginal discharge for the last couple days, denies any vaginal bleeding, she has had a miscarriage back in March no history similar to this event.  She is being followed by [facility] has had imaging confirming intrauterine pregnancy.  No other complaints..  Review of  Systems  Positive: Vaginal discharge, pelvic cramping Negative: Vaginal bleeding, nausea, vomiting diarrhea.  Physical Exam  BP 116/89 (BP Location: Right Arm)   Pulse 85   Temp 99.2 F (37.3 C) (Oral)   Resp 19   SpO2 100%  General: Awake, no distress  HEENT: Atraumatic  Resp: Normal effort  Cardiac: Normal rate Abd: Nondistended, nontender  MSK: Moves all extremities without difficulty Neuro: Speech clear  Medical Decision Making  Pt evaluated for pregnancy concern and is stable for transfer to MAU. Pt is in agreement with plan for transfer.  12:11 AM Discussed with MAU APP, who accepts patient in transfer.  Clinical Impression  No diagnosis found.     Carroll Sage, PA-C 01/15/21 Lorin Picket    Zadie Rhine, MD 01/15/21 [phone]

## 2021-01-15 NOTE — Progress Notes (Signed)
WRitten and verbal d/c instructions given and understanding voiced. Pt upset and crying. Emotional support given. Pt wheeled in w/c (with her 25yo in her lap) back to Main ED where she came to the hosp initially.

## 2021-01-15 NOTE — ED Triage Notes (Signed)
Patient is [redacted] weeks pregnant G3P1 , reports low abdominal cramping this evening , no contractions or vaginal bleeding .

## 2021-01-15 NOTE — ED Notes (Signed)
MAU notified on patient's condition/transfer.

## 2021-01-15 NOTE — MAU Note (Addendum)
Having lower abd cramping for 2 hours. Denies VB. (Pt came over from main ED). Hx of 17wk loss earlier this yr

## 2021-01-15 NOTE — Discharge Instructions (Signed)
Return to MAU: If you have heavy bright, red vaginal bleeding that soaks through more that 2 pads per hour for an hour or more If you bleed so much that you feel like you might pass out or you do pass out If you have significant abdominal pain that is not improved with Tylenol 1000 mg every 8 hours as needed for pain If you develop a fever > 100.5

## 2021-01-15 NOTE — MAU Provider Note (Signed)
History     CSN: [id number]  Arrival date and time: 01/14/21 2336   Event Date/Time   First Provider Initiated Contact with Patient 01/15/21 0111      Chief Complaint  Patient presents with   [redacted] Weeks Pregnant / Abdominal Cramping   Abdominal Pain   HPI Christie Hart is a 25 y.o. year old G45P1011 female at [redacted]w[redacted]d weeks gestation who was sent to MAU from [facility] reporting lower abdominal cramping/pain x 2 hours. She denies UTI sx's, VB or abnormal vaginal discharge. Her last SI was "about 2 wks ago." Her pregnancy is complicated by a second trimester loss in 05/2020. She does not know what the cause was for that loss. She reports she started having abdominal cramping, bleeding, water broke and "then the baby just came out." She receives [facility] with [facility], Dr. Claiborne Billings.  OB History     Gravida  3   Para  1   Term  1   Preterm      AB  1   Living  1      SAB  1   IAB      Ectopic      Multiple  0   Live Births  1           Past Medical History:  Diagnosis Date   ADHD (attention deficit hyperactivity disorder)    Bipolar affective disorder (HCC)    Depression    Hx of chlamydia infection    Sleep difficulties     Past Surgical History:  Procedure Laterality Date   DILATION AND EVACUATION N/A 05/16/2020   Procedure: DILATATION AND EVACUATION;  Surgeon: Carrington Clamp, MD;  Location: [facility] OR;  Service: Gynecology;  Laterality: N/A;   NO PAST SURGERIES      Family History  Problem Relation Age of Onset   Diabetes Mother    Hypertension Mother     Social History   Tobacco Use   Smoking status: Never   Smokeless tobacco: Never  Vaping Use   Vaping Use: Never used  Substance Use Topics   Alcohol use: No   Drug use: No    Allergies: No Known Allergies  Medications Prior to Admission  Medication Sig Dispense Refill Last Dose   Prenatal Vit-Fe Fumarate-FA (PRENATAL MULTIVITAMIN) TABS tablet Take 1 tablet by mouth daily at 12  noon.   01/14/2021   ibuprofen (ADVIL) 800 MG tablet Take 1 tablet (800 mg total) by mouth every 8 (eight) hours. 30 tablet 0     Review of Systems  Constitutional: Negative.   HENT: Negative.    Eyes: Negative.   Respiratory: Negative.    Cardiovascular: Negative.   Gastrointestinal: Negative.   Endocrine: Negative.   Genitourinary:  Positive for pelvic pain (cramping).  Musculoskeletal: Negative.   Skin: Negative.   Allergic/Immunologic: Negative.   Neurological: Negative.   Hematological: Negative.   Psychiatric/Behavioral: Negative.    Physical Exam   Blood pressure 116/89, pulse 85, temperature 98.4 F (36.9 C), resp. rate 18, height 5\' 9"  (1.753 m), weight 63.5 kg, SpO2 100 %, unknown if currently breastfeeding.  Physical Exam Vitals and nursing note reviewed. Exam conducted with a chaperone present.  Constitutional:      Appearance: Normal appearance. She is normal weight.  Cardiovascular:     Rate and Rhythm: Normal rate.  Pulmonary:     Effort: Pulmonary effort is normal.  Abdominal:     Palpations: Abdomen is soft.  Genitourinary:  General: Normal vulva.  Skin:    General: Skin is warm and dry.  Neurological:     Mental Status: She is alert.  Psychiatric:        Mood and Affect: Mood normal.        Behavior: Behavior normal.        Thought Content: Thought content normal.        Judgment: Judgment normal.   FHTs by doppler: 155 bpm  MAU Course  Procedures  MDM CCUA Wet Prep GC/CT -- Results pending  OB Limited U/S  Results for orders placed or performed during the hospital encounter of 01/14/21 (from the past 24 hour(s))  Urinalysis, Routine w reflex microscopic Urine, Clean Catch     Status: Abnormal   Collection Time: 01/15/21 12:45 AM  Result Value Ref Range   Color, Urine YELLOW YELLOW   APPearance HAZY (A) CLEAR   Specific Gravity, Urine 1.009 1.005 - 1.030   pH 6.0 5.0 - 8.0   Glucose, UA NEGATIVE NEGATIVE mg/dL   Hgb urine dipstick  NEGATIVE NEGATIVE   Bilirubin Urine NEGATIVE NEGATIVE   Ketones, ur NEGATIVE NEGATIVE mg/dL   Protein, ur NEGATIVE NEGATIVE mg/dL   Nitrite NEGATIVE NEGATIVE   Leukocytes,Ua MODERATE (A) NEGATIVE   RBC / HPF 0-5 0 - 5 RBC/hpf   WBC, UA 6-10 0 - 5 WBC/hpf   Bacteria, UA RARE (A) NONE SEEN   Squamous Epithelial / LPF 6-10 0 - 5  Wet prep, genital     Status: Abnormal   Collection Time: 01/15/21  1:25 AM   Specimen: Vaginal  Result Value Ref Range   Yeast Wet Prep HPF POC NONE SEEN NONE SEEN   Trich, Wet Prep NONE SEEN NONE SEEN   Clue Cells Wet Prep HPF POC NONE SEEN NONE SEEN   WBC, Wet Prep HPF POC MANY (A) NONE SEEN   Sperm NONE SEEN       Assessment and Plan  Antepartum placental abruption in second trimester  - Information provided on placental abruption - Return to MAU: If you have heavy vaginal bleeding that soaks through more that 2 pads per hour for an hour or more If you bleed so much that you feel like you might pass out or you do pass out If you have significant abdominal pain that is not improved with Tylenol 1000 mg every 8 hours as needed for pain If you develop a fever > 100.5    [redacted] weeks gestation of pregnancy   - Discharge patient - Call [facility] in the morning to schedule a sooner appt that the one she has scheduled on 01/28/21 - Patient verbalized an understanding of the plan of care and agrees.    Raelyn Mora, CNM 01/15/2021, 1:11 AM

## 2021-01-16 LAB — GC/CHLAMYDIA PROBE AMP (~~LOC~~) NOT AT ARMC
Chlamydia: NEGATIVE
Comment: NEGATIVE
Comment: NORMAL
Neisseria Gonorrhea: NEGATIVE

## 2021-01-24 ENCOUNTER — Other Ambulatory Visit: Payer: Self-pay

## 2021-01-24 ENCOUNTER — Other Ambulatory Visit: Payer: Self-pay | Admitting: Obstetrics and Gynecology

## 2021-01-24 DIAGNOSIS — Z363 Encounter for antenatal screening for malformations: Secondary | ICD-10-CM

## 2021-02-13 IMAGING — US US OB LIMITED
1 series · 14 of 28 positions shown · non-contrast
Comparison: none

CLINICAL DATA: Pelvic pain and vaginal bleeding.  Pregnant patient.

EXAM:
LIMITED OBSTETRIC ULTRASOUND

[Series 1: us ob limited · 50 acquisitions, 14 frames shown]
[im 2/50]
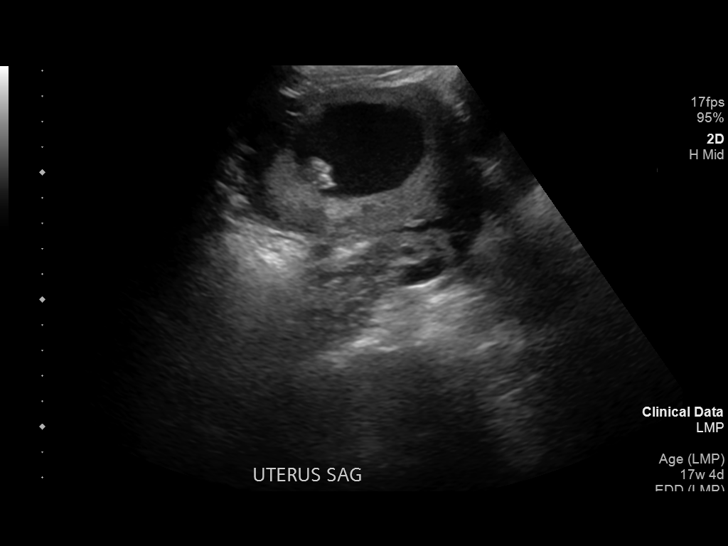
[im 6/50]
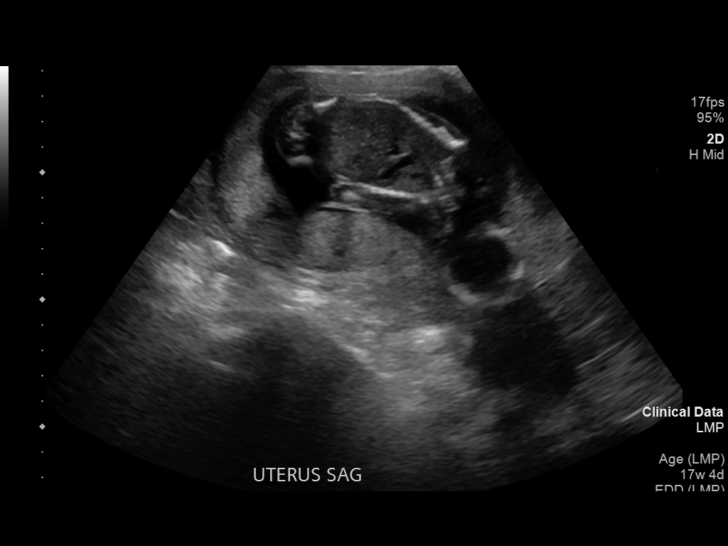
[im 10/50]
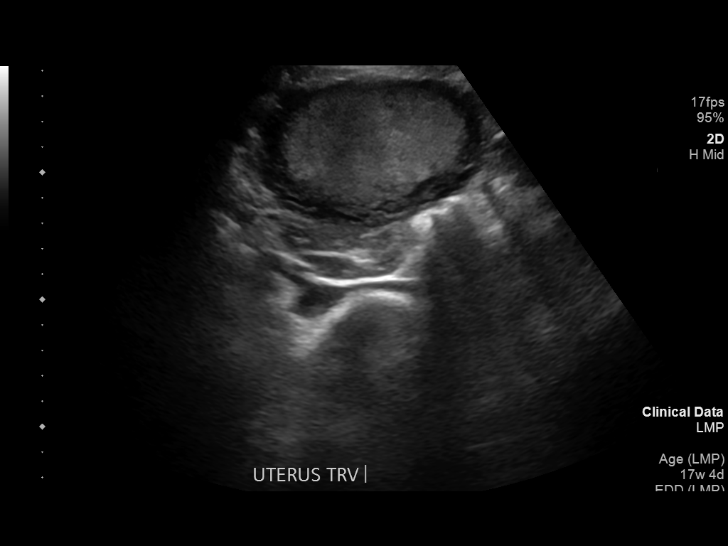
[im 13/50]
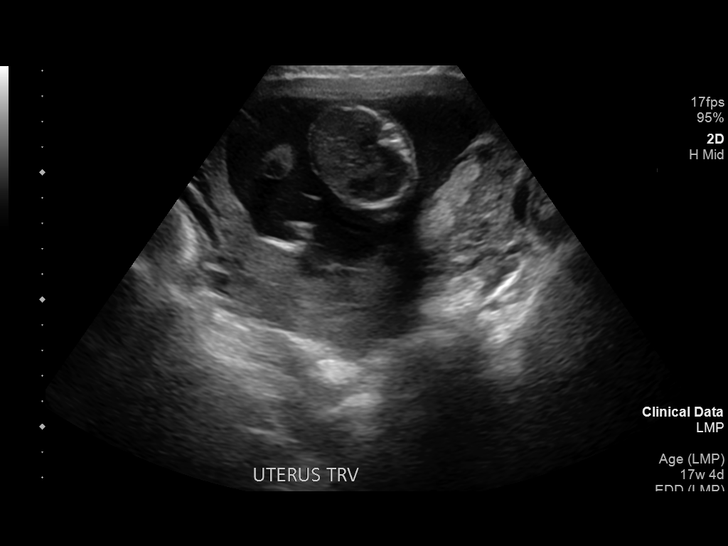
[im 17/50]
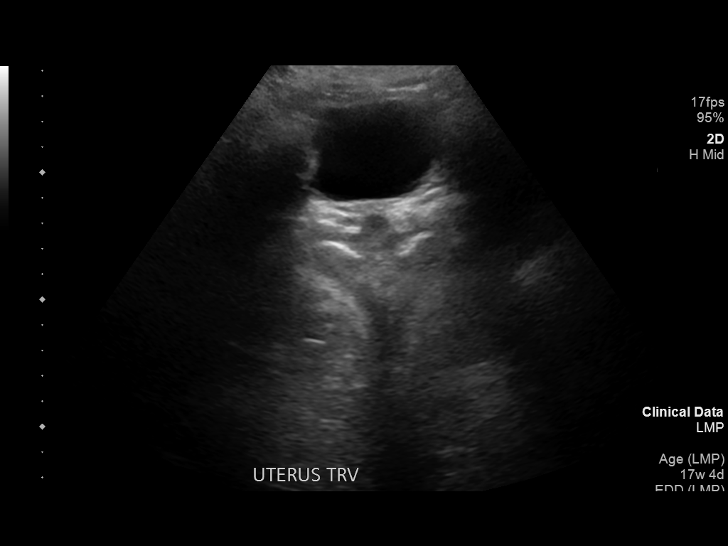
[im 20/50]
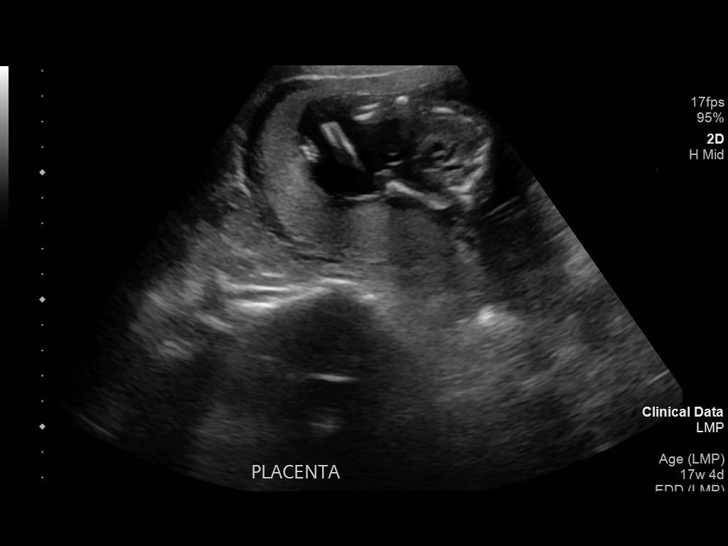
[im 24/50]
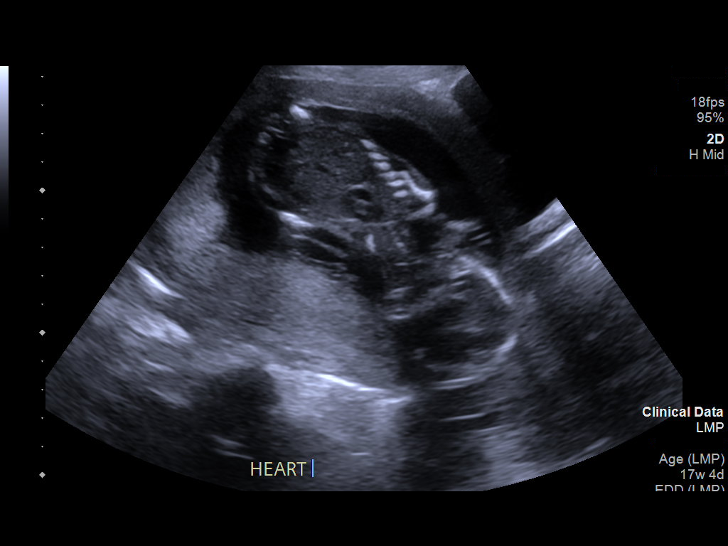
[im 28/50]
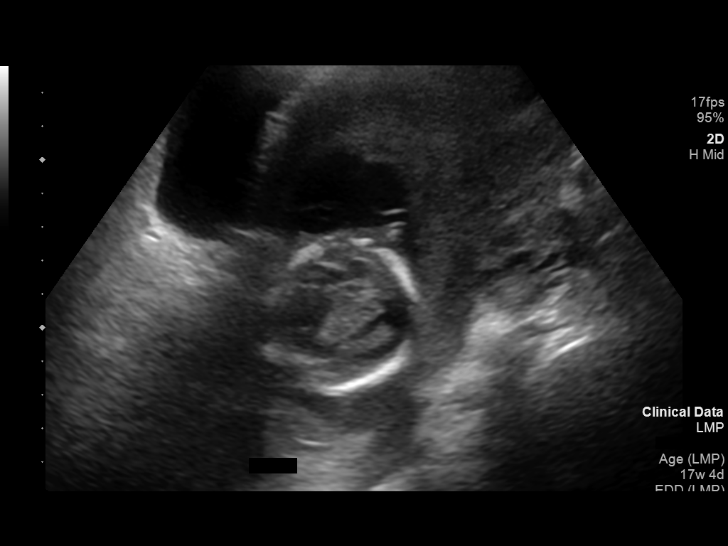
[im 31/50]
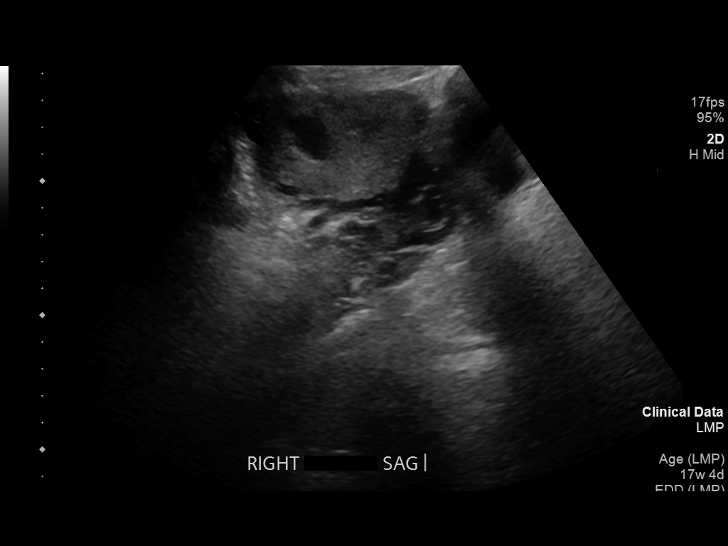
[im 35/50]
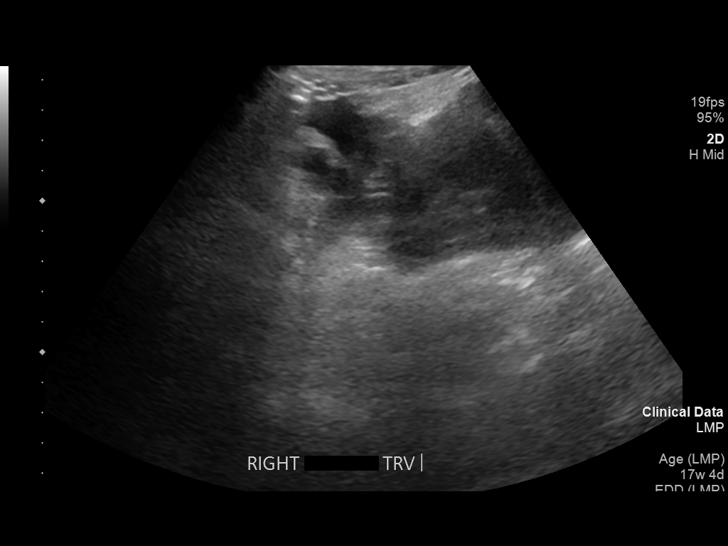
[im 39/50]
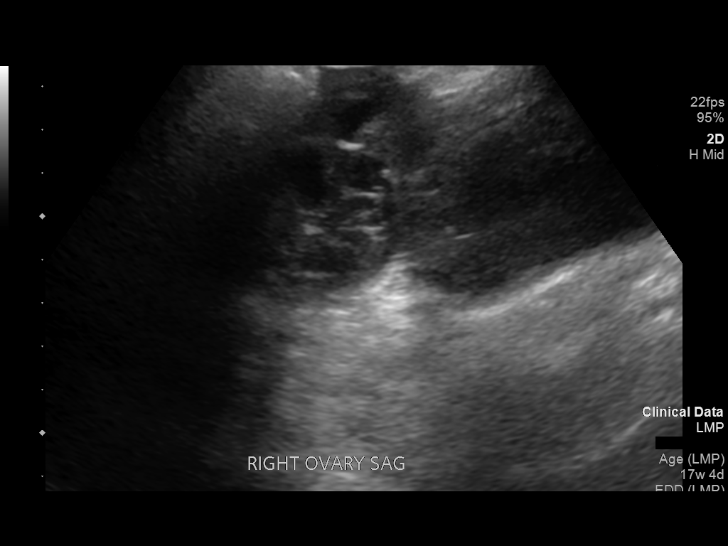
[im 42/50]
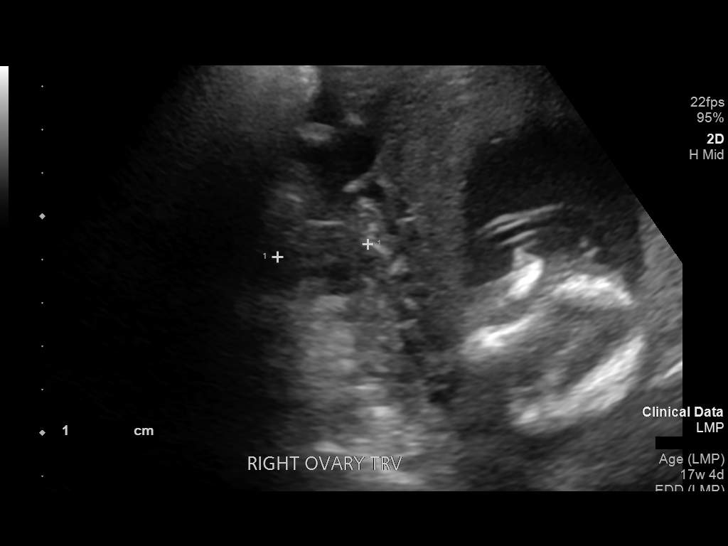
[im 46/50]
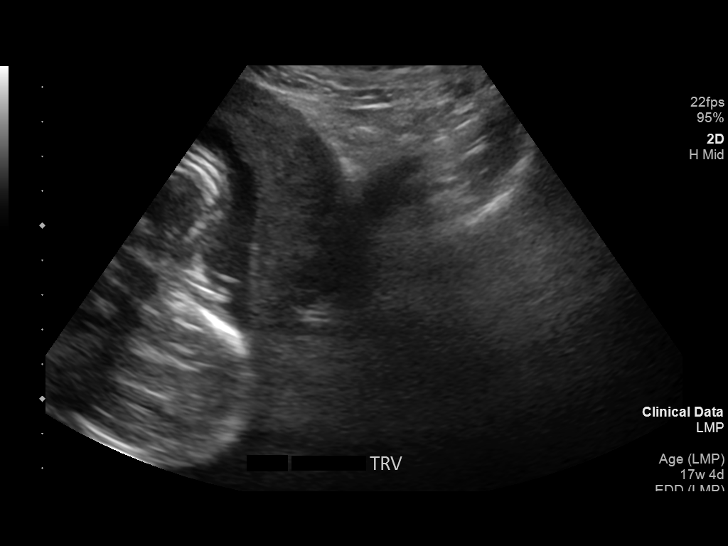
[im 50/50]
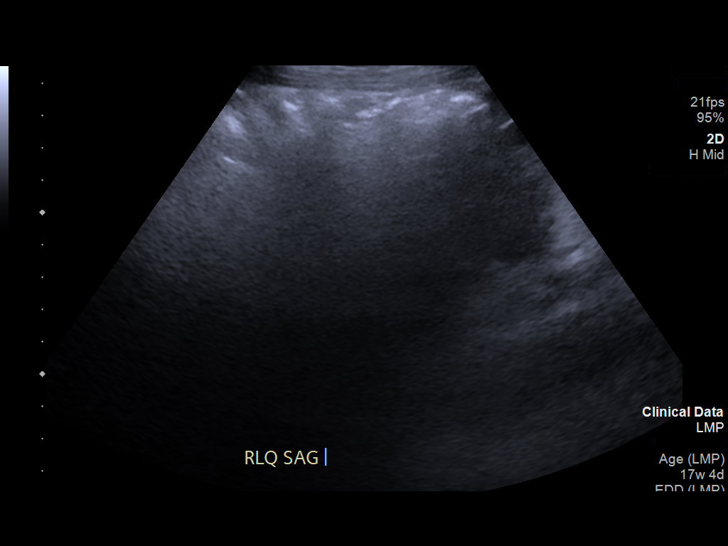

[14 of 28 positions shown; findings below may reference images not displayed]

FINDINGS: Number of Fetuses: 1

Heart Rate:  151 bpm

Movement: Yes

Presentation: Cephalic

Placental Location: Posterior

Previa: No

Amniotic Fluid (Subjective):  Within normal limits.

FL: 2.51 cm 17 w  4 d

MATERNAL FINDINGS:

Cervix:  Appears closed.

Uterus/Adnexae: No uterine masses. Normal right ovary. Left ovary
not visualized. No adnexal masses. Trace amount of right adnexal
free fluid.
IMPRESSION: 1. Single live intrauterine pregnancy with a measured gestational
age of 17 weeks and 4 days. No pregnancy complication. No findings
to account for pelvic pain.

This exam is performed on an emergent basis and does not
comprehensively evaluate fetal size, dating, or anatomy; follow-up
complete OB US should be considered if further fetal assessment is
warranted.

## 2021-02-17 ENCOUNTER — Other Ambulatory Visit: Payer: Self-pay

## 2021-02-17 ENCOUNTER — Ambulatory Visit: Payer: Medicaid Other

## 2021-02-18 ENCOUNTER — Ambulatory Visit: Payer: Medicaid Other | Admitting: *Deleted

## 2021-02-18 ENCOUNTER — Ambulatory Visit: Payer: Medicaid Other | Attending: Obstetrics and Gynecology

## 2021-02-18 ENCOUNTER — Encounter: Payer: Self-pay | Admitting: *Deleted

## 2021-02-18 ENCOUNTER — Other Ambulatory Visit: Payer: Self-pay | Admitting: Obstetrics and Gynecology

## 2021-02-18 ENCOUNTER — Other Ambulatory Visit: Payer: Self-pay

## 2021-02-18 ENCOUNTER — Ambulatory Visit (HOSPITAL_BASED_OUTPATIENT_CLINIC_OR_DEPARTMENT_OTHER): Payer: Medicaid Other | Admitting: Obstetrics and Gynecology

## 2021-02-18 ENCOUNTER — Other Ambulatory Visit: Payer: Self-pay | Admitting: *Deleted

## 2021-02-18 VITALS — BP 114/53 | HR 68

## 2021-02-18 DIAGNOSIS — Z363 Encounter for antenatal screening for malformations: Secondary | ICD-10-CM | POA: Diagnosis not present

## 2021-02-18 DIAGNOSIS — O09292 Supervision of pregnancy with other poor reproductive or obstetric history, second trimester: Secondary | ICD-10-CM | POA: Diagnosis not present

## 2021-02-18 DIAGNOSIS — Z3A19 19 weeks gestation of pregnancy: Secondary | ICD-10-CM | POA: Insufficient documentation

## 2021-02-18 DIAGNOSIS — Z362 Encounter for other antenatal screening follow-up: Secondary | ICD-10-CM

## 2021-02-18 DIAGNOSIS — O4692 Antepartum hemorrhage, unspecified, second trimester: Secondary | ICD-10-CM | POA: Insufficient documentation

## 2021-02-18 NOTE — Progress Notes (Signed)
Maternal-Fetal Medicine   Name: Christie Hart DOB: Aug 16, 1995 MRN: [id number] Referring Provider: Derl Barrow, MD  I had the pleasure of seeing [facility]. She is G3 P1011 at 19w 3d gestation and is here for fetal anatomy scan.  She was accompanied by her mother. Obstetric history significant for a midtrimester pregnancy loss at [redacted] weeks gestation.  Obstetrical history -05/2019: Term vaginal delivery of a female infant weighing 7 pounds at birth.  Her pregnancy and delivery were uncomplicated. -04/2020.  At [redacted] weeks gestation, patient had abdominal cramping and she was evaluated the ED at [facility].  On ultrasound performed then, amniotic fluid was normal and good fetal heart activity was seen.  The report did not mention cervical dilation or funneling.  About 12 hours later, patient had rupture membranes and, unfortunately, had a miscarriage.  She did not have fever or infections.  On 05/16/2020, she had D&C for retained products of conception. GYN history: No history of abnormal Pap smears or cervical surgeries. Past medical history: No history of diabetes or hypertension or any chronic medical conditions. Past surgical history: Nil of note. Medications: Prenatal vitamins. Allergies: No known drug allergies. Social history: Denies tobacco or drug or alcohol use.  She is married and her husband is in good health. Family history: Mother had DVT and is off anticoagulants now.  Prenatal course: On cell free fetal DNA screening, the risks of fetal aneuploidies are not increased.  At first-trimester ultrasound, subchorionic hemorrhage was noted.  Ultrasound We performed fetal anatomical survey.  No markers of aneuploidy's or fetal structural defects are seen.  Fetal biometry is consistent with the previously established dates.  Amniotic fluid is normal and good fetal activity seen. Because of her midtrimester pregnancy loss, we  performed a transvaginal ultrasound to evaluate the cervix.  The cervix measures 3.6 cm, which is within normal limits.  No shortening or funneling was seen on transfundal pressure.  Blood pressure today at her office is 114/53 mmHg.  I counseled the patient on the following History of mid-trimester pregnancy loss We discussed the possible causes including cervical insufficiency.  Patient did not have any fever and her white blood count at admission was normal.  On ultrasound performed at [redacted] weeks gestation when patient had abdominal cramping, no mention of cervical dilation was reported. I reassured that given her history of term vaginal delivery, recurrence rate of midtrimester loss of preterm delivery is low. I have recommended weekly cervical length measurements to [redacted] weeks gestation to rule out cervical incompetence.  Prophylactic cerclage is an option.  However, because of a history and the presence of normal cervical length measurement, I do not commend cerclage now.  Patient reports she will be having cervical length measurements at your office.  Recommendations -Weekly cervical length measurements (7 to 10 days interval). -An appointment was made for her to return in 4 weeks for completion of fetal anatomy and cervical length measurement.  Thank you for consultation.  If you have any questions or concerns, please contact me the [facility].  Consultation including face-to-face (more than 50%) counseling 30 minutes.

## 2021-02-24 ENCOUNTER — Other Ambulatory Visit: Payer: Self-pay | Admitting: *Deleted

## 2021-02-24 DIAGNOSIS — Z362 Encounter for other antenatal screening follow-up: Secondary | ICD-10-CM

## 2021-02-24 DIAGNOSIS — Z8759 Personal history of other complications of pregnancy, childbirth and the puerperium: Secondary | ICD-10-CM

## 2021-03-09 NOTE — L&D Delivery Note (Signed)
Delivery Note ?Christie Hart is a W2H8527 at [redacted]w[redacted]d who had a spontaneous delivery at 0008 a viable female was delivered via LOA.  APGAR: 9, 9; weight 8lb 6oz (3800g)  .    ? ?Admitted in active labor at 6cm. While sitting up for epidural, membranes ruptured spontaneously and patient felt urge to push. Confirmed fully dilated. No epidural received.  Pushed for 4 minutes. Baby was delivered without difficulty. Loose nuchal cord x 1, reduced at perineum.  Delayed cord clamping for 60 seconds.  Delivery of placenta was spontaneous. Placenta was found to be intact, 3 -vessel cord was noted. The fundus was found to be firm. Perineum intact. Estimated blood loss 200cc. Instrument and gauze counts were correct at the end of the procedure. ? ? ?Placenta status:  to pathology due to suspected abruption earlier in pregnancy ? ?Anesthesia:  none ?Episiotomy:  none ?Lacerations:  none ?Suture Repair: N/A ?Est. Blood Loss (mL):  200 ? ?Mom to postpartum.  Baby to Couplet care / Skin to Skin. ? ?Charlett Nose ?07/12/2021, 12:31 AM ? ?

## 2021-03-10 IMAGING — US US PELVIS COMPLETE WITH TRANSVAGINAL
1 series · 15 of 25 positions shown · non-contrast
Comparison: 04/21/2020 obstetric scan.

CLINICAL DATA: 24-year-old female with persistent vaginal bleeding
since miscarriage on 04/22/2020.

EXAM:
TRANSABDOMINAL AND TRANSVAGINAL ULTRASOUND OF PELVIS
TECHNIQUE: Both transabdominal and transvaginal ultrasound examinations of the
pelvis were performed. Transabdominal technique was performed for
global imaging of the pelvis including uterus, ovaries, adnexal
regions, and pelvic cul-de-sac. It was necessary to proceed with
endovaginal exam following the transabdominal exam to visualize the
endometrium and adnexa.

[Series 1: us pelvis complete with transvaginal · 15 of 52 slices shown]
[im 1/52]
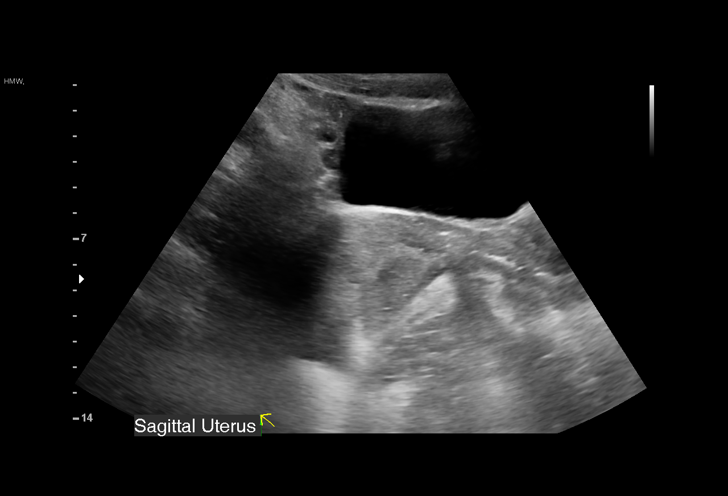
[im 5/52]
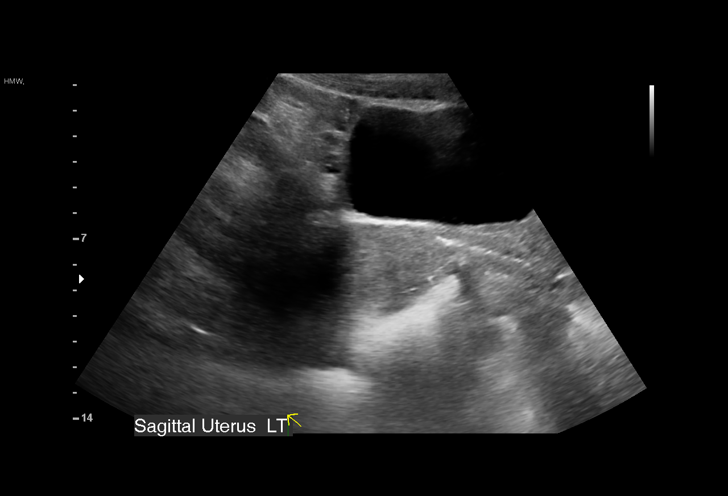
[im 9/52]
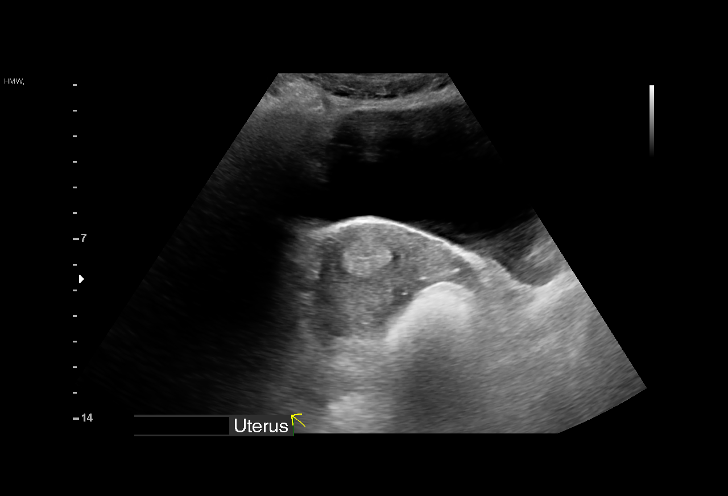
[im 11/52]
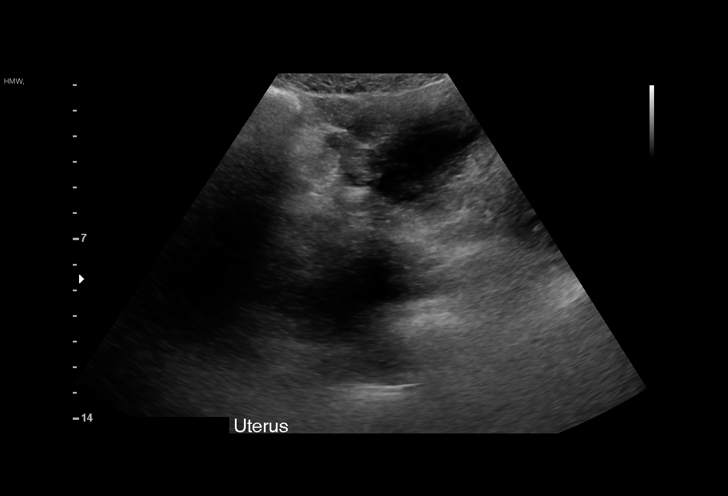
[im 15/52]
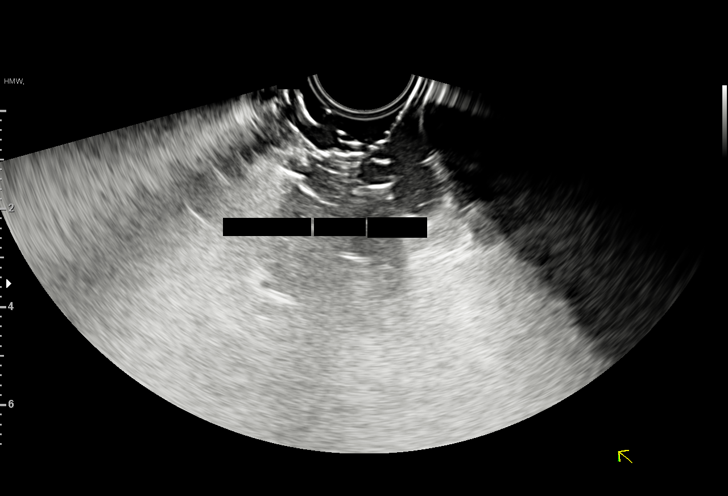
[im 20/52]
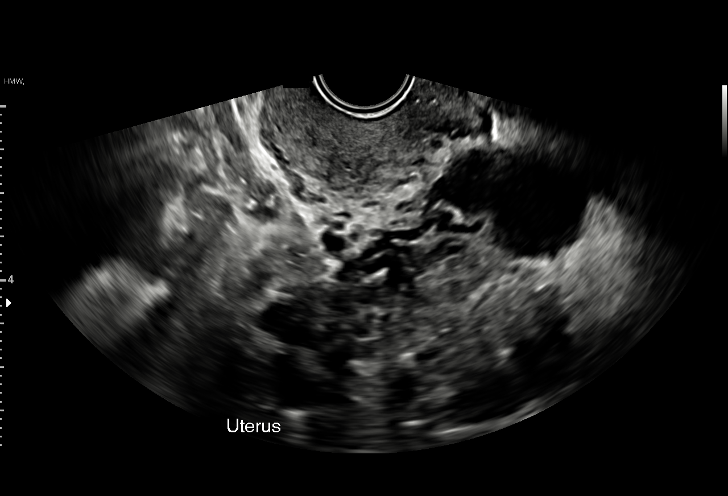
[im 22/52]
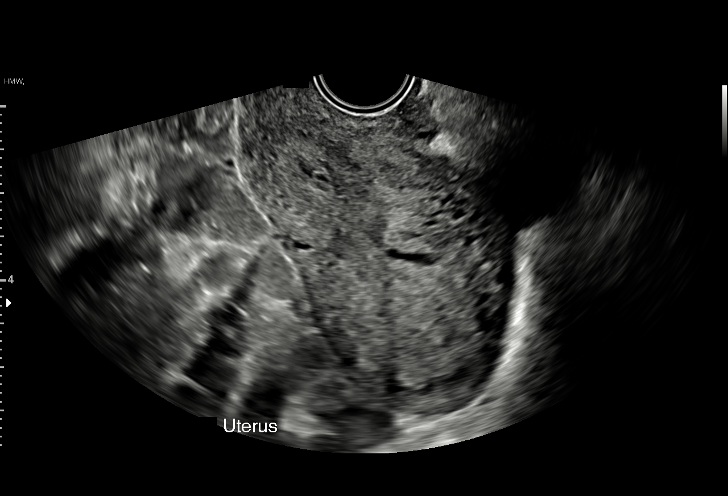
[im 26/52]
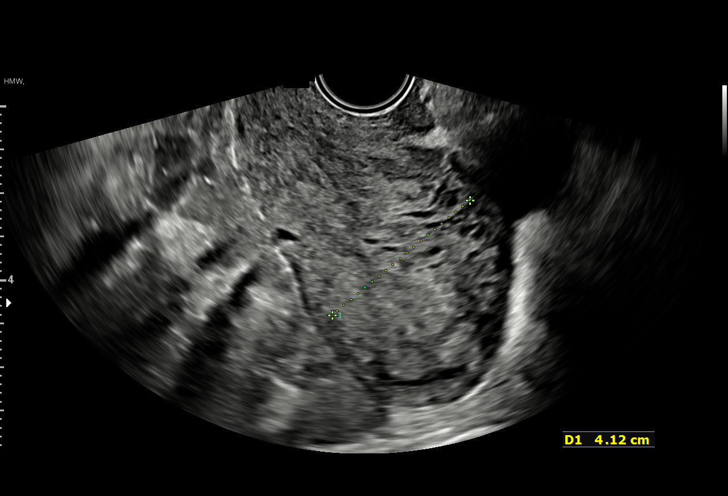
[im 30/52]
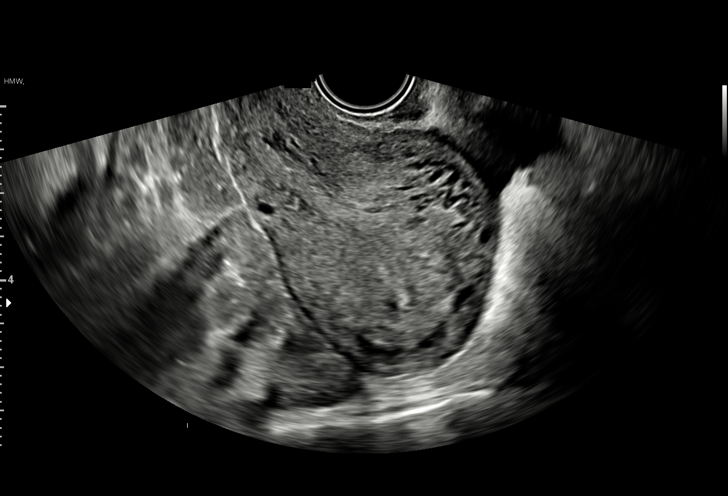
[im 32/52]
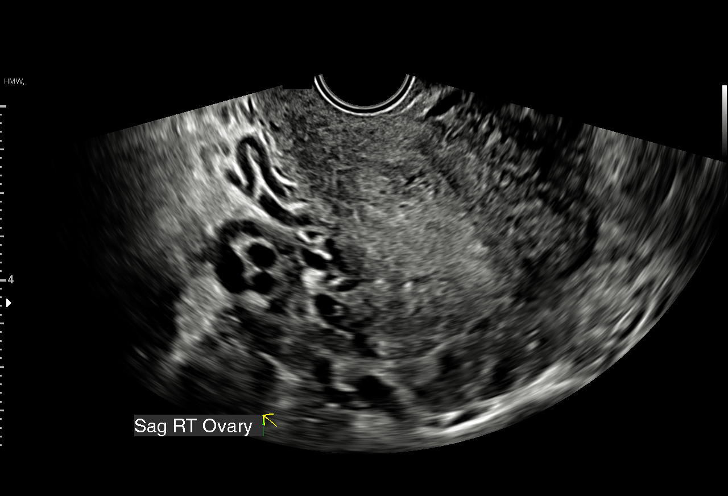
[im 37/52]
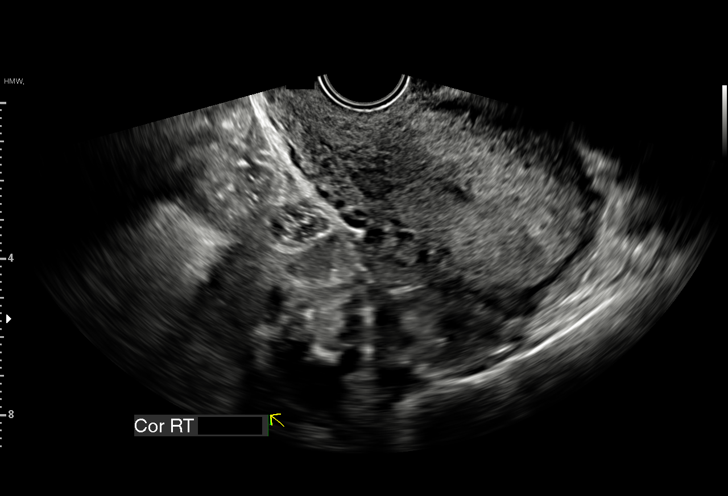
[im 41/52]
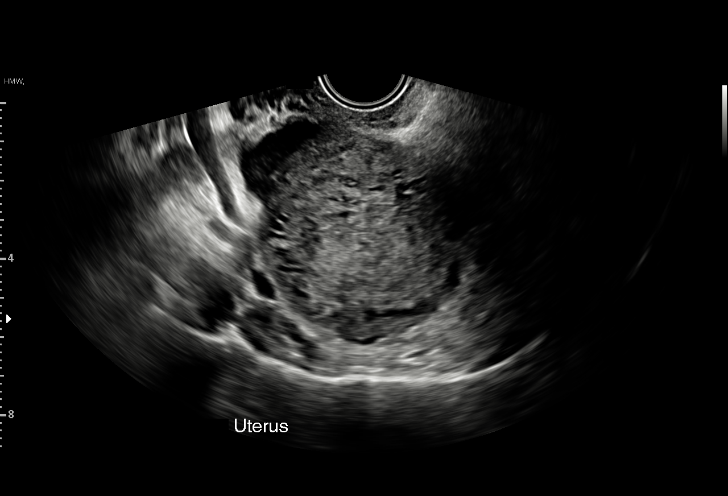
[im 43/52]
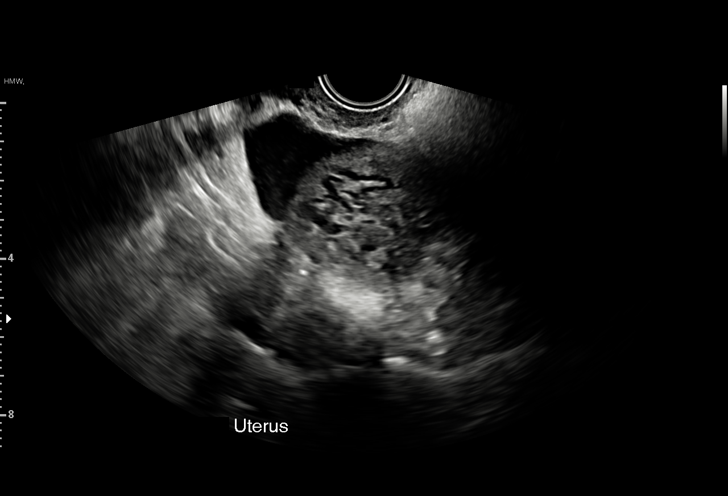
[im 47/52]
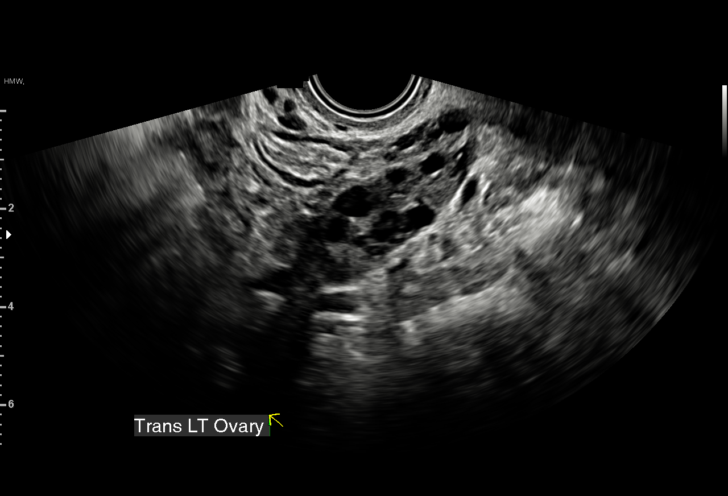
[im 52/52]
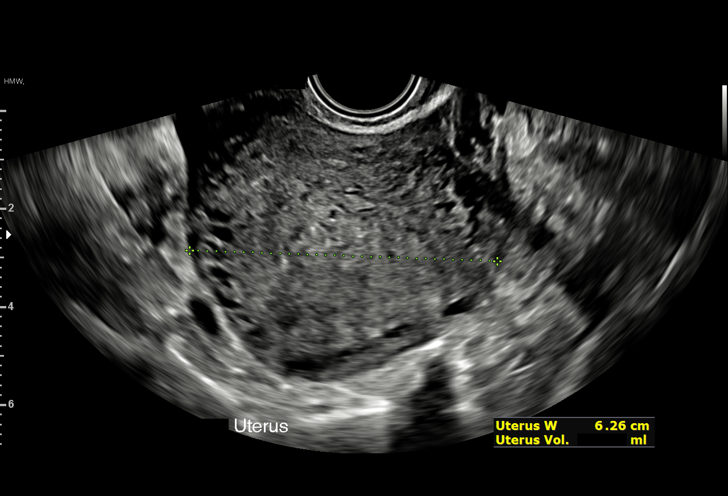

[15 of 25 positions shown; findings below may reference images not displayed]

FINDINGS: Uterus

Measurements: 8.0 x 5.2 x 6.3 cm = volume: 136 mL. Retroverted
uterus is mildly enlarged. No uterine fibroids or other myometrial
abnormalities.

Endometrium

Thickness: 11 mm. The margins of the endometrium are poorly
delineated. There is apparent mild thickening and heterogeneity of
the endometrium, with no discrete endometrial mass. There may be
mild vascularity present at the margins of the endometrium on color
Doppler.

Right ovary

Measurements: 2.8 x 2.2 x 2.7 cm = volume: 8.7 mL. Normal
appearance/no adnexal mass.

Left ovary

Measurements: 3.3 x 1.6 x 2.4 cm = volume: 6.6 mL. Normal
appearance/no adnexal mass.

Other findings

No abnormal free fluid.
IMPRESSION: 1. Poorly delineated endometrial margins. Apparent mild thickening
(11 mm) and heterogeneity of the endometrium, with no discrete
endometrial mass. Retained products of conception cannot be excluded
on the basis of this scan. MRI pelvis without and with IV contrast
may be considered for further evaluation, as clinically warranted.
2. Normal ovaries.  No adnexal masses.

## 2021-03-12 ENCOUNTER — Inpatient Hospital Stay (HOSPITAL_COMMUNITY)
Admission: AD | Admit: 2021-03-12 | Discharge: 2021-03-12 | Disposition: A | Discharge status: Home / Self Care | Payer: Medicaid Other | Attending: Obstetrics and Gynecology | Admitting: Obstetrics and Gynecology

## 2021-03-12 ENCOUNTER — Other Ambulatory Visit: Payer: Self-pay

## 2021-03-12 ENCOUNTER — Encounter (HOSPITAL_COMMUNITY): Payer: Self-pay | Admitting: Obstetrics and Gynecology

## 2021-03-12 ENCOUNTER — Inpatient Hospital Stay (HOSPITAL_BASED_OUTPATIENT_CLINIC_OR_DEPARTMENT_OTHER): Payer: Medicaid Other

## 2021-03-12 DIAGNOSIS — O26892 Other specified pregnancy related conditions, second trimester: Secondary | ICD-10-CM

## 2021-03-12 DIAGNOSIS — Z3A22 22 weeks gestation of pregnancy: Secondary | ICD-10-CM

## 2021-03-12 DIAGNOSIS — R109 Unspecified abdominal pain: Secondary | ICD-10-CM | POA: Insufficient documentation

## 2021-03-12 DIAGNOSIS — N949 Unspecified condition associated with female genital organs and menstrual cycle: Secondary | ICD-10-CM | POA: Diagnosis not present

## 2021-03-12 DIAGNOSIS — R102 Pelvic and perineal pain: Secondary | ICD-10-CM | POA: Diagnosis not present

## 2021-03-12 DIAGNOSIS — O26899 Other specified pregnancy related conditions, unspecified trimester: Secondary | ICD-10-CM

## 2021-03-12 LAB — URINALYSIS, ROUTINE W REFLEX MICROSCOPIC
Bilirubin Urine: NEGATIVE
Glucose, UA: NEGATIVE mg/dL
Hgb urine dipstick: NEGATIVE
Ketones, ur: NEGATIVE mg/dL
Nitrite: NEGATIVE
Protein, ur: NEGATIVE mg/dL
Specific Gravity, Urine: 1.025 (ref 1.005–1.030)
pH: 7 (ref 5.0–8.0)

## 2021-03-12 LAB — URINALYSIS, MICROSCOPIC (REFLEX)

## 2021-03-12 MED ORDER — IBUPROFEN 600 MG PO TABS
600.0000 mg | ORAL_TABLET | Freq: Once | ORAL | Status: AC
Start: 2021-03-12 — End: 2021-03-12
  Administered 2021-03-12: 600 mg via ORAL
  Filled 2021-03-12: qty 1

## 2021-03-12 NOTE — MAU Note (Signed)
...  Christie Hart is a 26 y.o. at [redacted]w[redacted]d here in MAU reporting: Intermittent sharp abdominal pain since 0700 this morning. She states the pain is in her mid lower stomach and at times she also feels it bilaterally. She states sitting down is the only thing that relieves her pain. +FM. No VB or LOF. She states her vaginal discharge is white and thin.   Pain score:  8/10 abdomen  FHT: 156 doppler Lab orders placed from triage: UA

## 2021-03-12 NOTE — Discharge Instructions (Signed)

## 2021-03-12 NOTE — MAU Provider Note (Signed)
History     CSN: [id number]  Arrival date and time: 03/12/21 1000   Event Date/Time   First Provider Initiated Contact with Patient 03/12/21 1058      Chief Complaint  Patient presents with   Abdominal Pain   HPI  Ms.Christie BillingsRebecca J Hart is a 26 y.o. Female G3P1011 here with abdominal pain that started this morning. The pain is in the middle of her belly and at times radiates to both sides of her lower abdomen. The pain comes and goes. She feels the pain every few minutes. She rates her pain 8/10 when it comes. She has not tried anything for the pain. She left work d/t the pain.  Moving makes the pain worse. Resting improves the pain. Denies bleeding.   HX of 17w PPROM.   OB History     Gravida  3   Para  1   Term  1   Preterm      AB  1   Living  1      SAB  1   IAB      Ectopic      Multiple  0   Live Births  1           Past Medical History:  Diagnosis Date   ADHD (attention deficit hyperactivity disorder)    Bipolar affective disorder (HCC)    Depression    Hx of chlamydia infection    Sleep difficulties     Past Surgical History:  Procedure Laterality Date   DILATION AND EVACUATION N/A 05/16/2020   Procedure: DILATATION AND EVACUATION;  Surgeon: Carrington ClampHorvath, Michelle, MD;  [facility];  Service: Gynecology;  Laterality: N/A;   NO PAST SURGERIES      Family History  Problem Relation Age of Onset   Diabetes Mother    Hypertension Mother     Social History   Tobacco Use   Smoking status: Never   Smokeless tobacco: Never  Vaping Use   Vaping Use: Never used  Substance Use Topics   Alcohol use: No   Drug use: No    Allergies: No Known Allergies  Medications Prior to Admission  Medication Sig Dispense Refill Last Dose   Prenatal Vit-Fe Fumarate-FA (PRENATAL MULTIVITAMIN) TABS tablet Take 1 tablet by mouth daily at 12 noon.      Results for orders placed or performed during the hospital encounter of 03/12/21 (from the past 48  hour(s))  Urinalysis, Routine w reflex microscopic Urine, Clean Catch     Status: Abnormal   Collection Time: 03/12/21 10:33 AM  Result Value Ref Range   Color, Urine YELLOW YELLOW   APPearance HAZY (A) CLEAR   Specific Gravity, Urine 1.025 1.005 - 1.030   pH 7.0 5.0 - 8.0   Glucose, UA NEGATIVE NEGATIVE mg/dL   Hgb urine dipstick NEGATIVE NEGATIVE   Bilirubin Urine NEGATIVE NEGATIVE   Ketones, ur NEGATIVE NEGATIVE mg/dL   Protein, ur NEGATIVE NEGATIVE mg/dL   Nitrite NEGATIVE NEGATIVE   Leukocytes,Ua MODERATE (A) NEGATIVE    Comment: Performed at [facility], [address] [place], [place] [address]  Urinalysis, Microscopic (reflex)     Status: Abnormal   Collection Time: 03/12/21 10:33 AM  Result Value Ref Range   RBC / HPF 0-5 0 - 5 RBC/hpf   WBC, UA 11-20 0 - 5 WBC/hpf   Bacteria, UA MANY (A) NONE SEEN   Squamous Epithelial / LPF 11-20 0 - 5   Mucus PRESENT  Comment: Performed at [facility], [address] [place], [place] [address]    US MFM OB LIMITED  Result Date: 03/12/2021 ----------------------------------------------------------------------  OBSTETRICS REPORT                       (Signed Final 03/12/2021 05:11 pm) ---------------------------------------------------------------------- Patient Info  ID #:       [id number]                          D.O.B.:  1995-08-21 (25 yrs)  Name:       Christie BannisterREBECCA [facility] [address]             Visit Date: 03/12/2021 11:29 am ---------------------------------------------------------------------- Performed By  Attending:        Noralee Spaceavi Shankar MD        Ref. Address:     [address]                                                             [address] [place],                                                             [place] [address]  Performed By:     Marcellina MillinKelly L Moser          [facility] Ph[facility].:   [facility]                    RDMS  Referred By:      Harolyn RutherfordJENNIFER I             [facility] ---------------------------------------------------------------------- Orders  #  Description                           Code        Ordered By  1  US MFM OB LIMITED                     [id number]    Emely Fahy [facility] ----------------------------------------------------------------------  #  Order #                     Accession #                Episode #  1  [id number]                   [id number]                 [id number] ---------------------------------------------------------------------- Indications  Abdominal pain in pregnancy  O99.89  [redacted] weeks gestation of pregnancy                Z3A.22 ---------------------------------------------------------------------- Fetal Evaluation  Num Of Fetuses:         1  Fetal Heart Rate(bpm):  137  Cardiac Activity:       Observed  Presentation:           Breech  Placenta:               Posterior  P. Cord Insertion:      Visualized  Amniotic Fluid  AFI FV:      Within normal limits                              Largest Pocket(cm)                              4.9  Comment:    No placental abruption or previa identified. ---------------------------------------------------------------------- OB History  Gravidity:    3         Term:   1         SAB:   1 ---------------------------------------------------------------------- Gestational Age  LMP:           22w 4d        Date:  10/05/20                 EDD:   07/12/21  Clinical EDD:  22w 4d                                        EDD:   07/12/21  Best:          22w 4d     Det. By:  Clinical EDD             EDD:   07/12/21 ---------------------------------------------------------------------- Anatomy  Diaphragm:             Appears normal         Kidneys:                Appear normal  Stomach:               Appears normal, left   Bladder:                Appears normal                         sided ---------------------------------------------------------------------- Cervix Uterus Adnexa   Cervix  Length:            4.4  cm.  Normal appearance by transabdominal scan.  Uterus  No abnormality visualized.  Right Ovary  Within normal limits.  Left Ovary  Within normal limits.  Adnexa  No abnormality visualized. ---------------------------------------------------------------------- Impression  Patient was evaluated for abdominal pain .  A limited ultrasound study was performed .Amniotic fluid is  normal and good fetal activity is seen .  On transabdominal  scan, the cervix looks long and closed.  Placenta appears  normal. ---------------------------------------------------------------------- Recommendations  Patient has an appointment at her office next week. ----------------------------------------------------------------------                  Noralee Space, MD Electronically Signed Final Report   03/12/2021 05:11 pm ----------------------------------------------------------------------  Review of Systems  Constitutional:  Negative for fever.  Gastrointestinal:  Positive for abdominal pain.  Genitourinary:  Negative for vaginal bleeding and vaginal discharge.  Musculoskeletal:  Negative for back pain.  Physical Exam   Blood pressure (!) 113/56, pulse 70, temperature 98.6 F (37 C), temperature source Oral, resp. rate 16, last menstrual period 10/05/2020, SpO2 100 %, unknown if currently breastfeeding.  Physical Exam Vitals and nursing note reviewed.  Constitutional:      General: She is not in acute distress.    Appearance: She is well-developed. She is not ill-appearing, toxic-appearing or diaphoretic.  HENT:     Head: Normocephalic.  Genitourinary:    Comments: Dilation: Closed Exam by:: Venia Carbon, NP  Neurological:     Mental Status: She is alert and oriented to person, place, and time.  Psychiatric:        Behavior: Behavior normal.   MAU Course  Procedures  MDM  Ibuprofen given 600 mg PO; pain now 0/10 Urine culture ordered and pending.   Assessment and Plan    A:  Round ligament pain - Plan: Discharge patient  [redacted] weeks gestation of pregnancy - Plan: [place] MFM OB LIMITED, [place] MFM OB LIMITED, Discharge patient  Abdominal pain affecting pregnancy - Plan: [place] MFM OB LIMITED, [place] MFM OB LIMITED, Discharge patient   P:  Discharge home Urine culture pending Return to MAU if symptoms worsen Discussed pregnancy support belt  Manfred Laspina, Harolyn Rutherford, NP 03/12/2021 7:32 PM

## 2021-03-14 LAB — URINE CULTURE

## 2021-03-19 ENCOUNTER — Other Ambulatory Visit: Payer: Self-pay

## 2021-03-19 ENCOUNTER — Ambulatory Visit: Payer: Medicaid Other | Attending: Obstetrics and Gynecology

## 2021-03-19 ENCOUNTER — Other Ambulatory Visit: Payer: Self-pay | Admitting: Obstetrics and Gynecology

## 2021-03-19 ENCOUNTER — Ambulatory Visit: Payer: Medicaid Other | Admitting: *Deleted

## 2021-03-19 VITALS — BP 115/48 | HR 71

## 2021-03-19 DIAGNOSIS — Z362 Encounter for other antenatal screening follow-up: Secondary | ICD-10-CM

## 2021-03-19 DIAGNOSIS — O09292 Supervision of pregnancy with other poor reproductive or obstetric history, second trimester: Secondary | ICD-10-CM | POA: Diagnosis not present

## 2021-03-19 DIAGNOSIS — Z3686 Encounter for antenatal screening for cervical length: Secondary | ICD-10-CM

## 2021-03-19 DIAGNOSIS — O09299 Supervision of pregnancy with other poor reproductive or obstetric history, unspecified trimester: Secondary | ICD-10-CM

## 2021-03-19 DIAGNOSIS — Z8759 Personal history of other complications of pregnancy, childbirth and the puerperium: Secondary | ICD-10-CM | POA: Diagnosis present

## 2021-03-19 DIAGNOSIS — Z3A23 23 weeks gestation of pregnancy: Secondary | ICD-10-CM

## 2021-06-18 LAB — OB RESULTS CONSOLE GBS: GBS: NEGATIVE

## 2021-07-04 ENCOUNTER — Telehealth (HOSPITAL_COMMUNITY): Payer: Self-pay | Admitting: *Deleted

## 2021-07-04 NOTE — Telephone Encounter (Signed)
Preadmission screen  

## 2021-07-09 ENCOUNTER — Telehealth (HOSPITAL_COMMUNITY): Payer: Self-pay | Admitting: *Deleted

## 2021-07-09 ENCOUNTER — Encounter (HOSPITAL_COMMUNITY): Payer: Self-pay | Admitting: *Deleted

## 2021-07-09 NOTE — Telephone Encounter (Signed)
Preadmission screen  

## 2021-07-11 ENCOUNTER — Inpatient Hospital Stay (HOSPITAL_COMMUNITY): Payer: Medicaid Other | Admitting: Anesthesiology

## 2021-07-11 ENCOUNTER — Inpatient Hospital Stay (HOSPITAL_COMMUNITY)
Admission: AD | Admit: 2021-07-11 | Discharge: 2021-07-13 | DRG: 807 | Disposition: A | Discharge status: Home / Self Care | Payer: Medicaid Other | Attending: Obstetrics and Gynecology | Admitting: Obstetrics and Gynecology

## 2021-07-11 DIAGNOSIS — Z3A39 39 weeks gestation of pregnancy: Secondary | ICD-10-CM

## 2021-07-11 DIAGNOSIS — O26893 Other specified pregnancy related conditions, third trimester: Secondary | ICD-10-CM | POA: Diagnosis present

## 2021-07-11 DIAGNOSIS — O479 False labor, unspecified: Principal | ICD-10-CM | POA: Diagnosis present

## 2021-07-11 LAB — CBC
HCT: 37.4 % (ref 36.0–46.0)
Hemoglobin: 12.1 g/dL (ref 12.0–15.0)
MCH: 28.1 pg (ref 26.0–34.0)
MCHC: 32.4 g/dL (ref 30.0–36.0)
MCV: 87 fL (ref 80.0–100.0)
Platelets: 192 10*3/uL (ref 150–400)
RBC: 4.3 MIL/uL (ref 3.87–5.11)
RDW: 13.3 % (ref 11.5–15.5)
WBC: 15.7 10*3/uL — ABNORMAL HIGH (ref 4.0–10.5)
nRBC: 0 % (ref 0.0–0.2)

## 2021-07-11 MED ORDER — LIDOCAINE HCL (PF) 1 % IJ SOLN: 30.0000 mL | INTRAMUSCULAR | Status: DC | PRN

## 2021-07-11 MED ORDER — SOD CITRATE-CITRIC ACID 500-334 MG/5ML PO SOLN: 30.0000 mL | ORAL | Status: DC | PRN

## 2021-07-11 MED ORDER — LACTATED RINGERS IV SOLN: 500.0000 mL | INTRAVENOUS | Status: DC | PRN

## 2021-07-11 MED ORDER — OXYCODONE-ACETAMINOPHEN 5-325 MG PO TABS: 1.0000 | ORAL_TABLET | ORAL | Status: DC | PRN

## 2021-07-11 MED ORDER — OXYTOCIN BOLUS FROM INFUSION: 333.0000 mL | Freq: Once | INTRAVENOUS | Status: DC

## 2021-07-11 MED ORDER — FENTANYL CITRATE (PF) 100 MCG/2ML IJ SOLN
50.0000 ug | INTRAMUSCULAR | Status: DC | PRN
  Administered 2021-07-12: 100 ug via INTRAVENOUS
  Filled 2021-07-11: qty 2

## 2021-07-11 MED ORDER — ACETAMINOPHEN 325 MG PO TABS: 650.0000 mg | ORAL_TABLET | ORAL | Status: DC | PRN

## 2021-07-11 MED ORDER — FENTANYL-BUPIVACAINE-NACL 0.5-0.125-0.9 MG/250ML-% EP SOLN
12.0000 mL/h | EPIDURAL | Status: DC | PRN
  Filled 2021-07-11: qty 250

## 2021-07-11 MED ORDER — PHENYLEPHRINE 80 MCG/ML (10ML) SYRINGE FOR IV PUSH (FOR BLOOD PRESSURE SUPPORT): 80.0000 ug | PREFILLED_SYRINGE | INTRAVENOUS | Status: DC | PRN

## 2021-07-11 MED ORDER — EPHEDRINE 5 MG/ML INJ: 10.0000 mg | INTRAVENOUS | Status: DC | PRN

## 2021-07-11 MED ORDER — LACTATED RINGERS IV SOLN: 500.0000 mL | Freq: Once | INTRAVENOUS | Status: DC

## 2021-07-11 MED ORDER — DIPHENHYDRAMINE HCL 50 MG/ML IJ SOLN: 12.5000 mg | INTRAMUSCULAR | Status: DC | PRN

## 2021-07-11 MED ORDER — LACTATED RINGERS IV SOLN: INTRAVENOUS | Status: DC

## 2021-07-11 MED ORDER — OXYCODONE-ACETAMINOPHEN 5-325 MG PO TABS: 2.0000 | ORAL_TABLET | ORAL | Status: DC | PRN

## 2021-07-11 MED ORDER — ONDANSETRON HCL 4 MG/2ML IJ SOLN: 4.0000 mg | Freq: Four times a day (QID) | INTRAMUSCULAR | Status: DC | PRN

## 2021-07-11 MED ORDER — OXYTOCIN-SODIUM CHLORIDE 30-0.9 UT/500ML-% IV SOLN
2.5000 [IU]/h | INTRAVENOUS | Status: DC
  Filled 2021-07-11: qty 500

## 2021-07-11 NOTE — MAU Note (Signed)
..  Christie Hart is a 26 y.o. at [redacted]w[redacted]d here in MAU reporting: contractions since 9pm that are now every 10 minutes. Had membrane sweep in the office and bleed some after that. Denies LOF.  ?5cm in the office ?GBS neg ? ?Pain score: 10/10 ?Vitals:  ? 07/11/21 2306  ?BP: 118/70  ?Pulse: 86  ?Resp: 16  ?Temp: 99.5 ?F (37.5 ?C)  ?SpO2: 100%  ?   ?WGY:659 ? ? ?

## 2021-07-11 NOTE — Anesthesia Preprocedure Evaluation (Deleted)
Anesthesia Evaluation  ?Patient identified by MRN, date of birth, ID band ?Patient awake ? ? ? ?Reviewed: ?Allergy & Precautions, NPO status , Patient's Chart, lab work & pertinent test results ? ?Airway ?Mallampati: II ? ?TM Distance: >3 FB ?Neck ROM: Full ? ? ? Dental ?no notable dental hx. ? ?  ?Pulmonary ?neg pulmonary ROS,  ?  ?Pulmonary exam normal ?breath sounds clear to auscultation ? ? ? ? ? ? Cardiovascular ?negative cardio ROS ?Normal cardiovascular exam ?Rhythm:Regular Rate:Normal ? ? ?  ?Neuro/Psych ?PSYCHIATRIC DISORDERS Depression Bipolar Disorder negative neurological ROS ?   ? GI/Hepatic ?negative GI ROS, Neg liver ROS,   ?Endo/Other  ?negative endocrine ROS ? Renal/GU ?negative Renal ROS  ?negative genitourinary ?  ?Musculoskeletal ?negative musculoskeletal ROS ?(+)  ? Abdominal ?  ?Peds ? ?(+) ADHD Hematology ?negative hematology ROS ?(+)   ?Anesthesia Other Findings ?Presents in labor ? Reproductive/Obstetrics ?(+) Pregnancy ? ?  ? ? ? ? ? ? ? ? ? ? ? ? ? ?  ?  ? ? ? ? ? ? ? ? ?Anesthesia Physical ?Anesthesia Plan ? ?ASA: 2 ? ?Anesthesia Plan: Epidural  ? ?Post-op Pain Management:   ? ?Induction:  ? ?PONV Risk Score and Plan: Treatment may vary due to age or medical condition ? ?Airway Management Planned: Natural Airway ? ?Additional Equipment:  ? ?Intra-op Plan:  ? ?Post-operative Plan:  ? ?Informed Consent: I have reviewed the patients History and Physical, chart, labs and discussed the procedure including the risks, benefits and alternatives for the proposed anesthesia with the patient or authorized representative who has indicated his/her understanding and acceptance.  ? ? ? ? ? ?Plan Discussed with: Anesthesiologist ? ?Anesthesia Plan Comments: (Patient identified. Risks, benefits, options discussed with patient including but not limited to bleeding, infection, nerve damage, paralysis, failed block, incomplete pain control, headache, blood pressure changes,  nausea, vomiting, reactions to medication, itching, and post partum back pain. Confirmed with bedside nurse the patient's most recent platelet count. Confirmed with the patient that they are not taking any anticoagulation, have any bleeding history or any family history of bleeding disorders. Patient expressed understanding and wishes to proceed. All questions were answered. )  ? ? ? ? ? ? ?Anesthesia Quick Evaluation ? ?

## 2021-07-11 NOTE — H&P (Signed)
Christie Hart is a 26 y.o. female G3P1011 [redacted]w[redacted]d presenting for contractions that started around 9pm and have gotten stronger. Exam in office today was 5cm and had membranes swept. Some bloody show. No LOF. Normal FM.  ? ?Pregnancy c/b: ?Possible SCH/abruption vs. Placental [place] noted on MAU ultrasound around 15 weeks, has remained stable and not mentioned on follow up MFM scans ?History of 17 week pregnancy loss, possible due to chorio: had serial cervical lengths this pregnancy. Complicated by retained POC treated with D&C ?Bipolar/depression: stable, not on meds ? ?OB History   ? ? Gravida  ?3  ? Para  ?1  ? Term  ?1  ? Preterm  ?   ? AB  ?1  ? Living  ?1  ?  ? ? SAB  ?1  ? IAB  ?   ? Ectopic  ?   ? Multiple  ?0  ? Live Births  ?1  ?   ?  ?  ? ?Past Medical History:  ?Diagnosis Date  ? ADHD (attention deficit hyperactivity disorder)   ? Bipolar affective disorder (HCC)   ? Depression   ? Hx of chlamydia infection   ? Sleep difficulties   ? ?Past Surgical History:  ?Procedure Laterality Date  ? DILATION AND EVACUATION N/A 05/16/2020  ? Procedure: DILATATION AND EVACUATION;  Surgeon: Carrington Clamp, MD;  Location: [facility] OR;  Service: Gynecology;  Laterality: N/A;  ? NO PAST SURGERIES    ? ?Family History: family history includes Diabetes in her mother; Hypertension in her mother. ?Social History:  reports that she has never smoked. She has never used smokeless tobacco. She reports that she does not drink alcohol and does not use drugs. ? ? ?  ?Maternal Diabetes: No ?Genetic Screening: Normal ?Maternal Ultrasounds/Referrals: Other: possible SCH vs. Placental [place] ?Fetal Ultrasounds or other Referrals:  Referred to Materal Fetal Medicine  ?Maternal Substance Abuse:  No ?Significant Maternal Medications:  None ?Significant Maternal Lab Results:  Group B Strep negative ?Other Comments:  None ? ?Review of Systems Per HPI ?Exam ?Physical Exam  ?Dilation: 6.5 ?Effacement (%): 90 ?Exam by:: Gardner Candle, RN ?Blood  pressure 121/65, pulse 82, temperature 97.9 ?F (36.6 ?C), temperature source Oral, resp. rate 20, height 5\' 9"  (1.753 m), weight 77.2 kg, last menstrual period 10/05/2020, SpO2 99 %, unknown if currently breastfeeding. ?Gen: NAD, resting, appears uncomfortable with contractions ?CVS: normal pulses ?Lungs: nonlabored respirations ?Abd: Gravid abdomen, Le[place] 7lb ?Ext: no calf edema or tenderness ? ?Fetal testing: 125bpm, mod variability, + accels, no decels ?[place]; ?Prenatal labs: ?ABO, Rh:  ?--/--/PENDING (05/05 2314) ?Antibody: PENDING (05/05 2314) ?Rubella:  immune ?RPR: Nonreactive (10/18 0000)  ?HBsAg: Negative (10/18 0000)  ?HIV: Non-reactive (10/18 0000)  ?GBS: Negative/-- (04/12 0000)  ? ?Assessment/Plan: ?25Y G3P1011 @ [redacted]w[redacted]d, active labor ?Fetal wellbeing: cat I tracing ?Labor: expectant management, consider AROM after epidural ?Pain control: epidural  ? ?[redacted]w[redacted]d ?07/11/2021, 11:52 PM ? ? ? ? ?

## 2021-07-12 ENCOUNTER — Other Ambulatory Visit: Payer: Self-pay

## 2021-07-12 ENCOUNTER — Encounter (HOSPITAL_COMMUNITY): Payer: Self-pay | Admitting: Obstetrics and Gynecology

## 2021-07-12 LAB — CBC
HCT: 31.5 % — ABNORMAL LOW (ref 36.0–46.0)
Hemoglobin: 10.4 g/dL — ABNORMAL LOW (ref 12.0–15.0)
MCH: 28.4 pg (ref 26.0–34.0)
MCHC: 33 g/dL (ref 30.0–36.0)
MCV: 86.1 fL (ref 80.0–100.0)
Platelets: 172 10*3/uL (ref 150–400)
RBC: 3.66 MIL/uL — ABNORMAL LOW (ref 3.87–5.11)
RDW: 13.3 % (ref 11.5–15.5)
WBC: 16.3 10*3/uL — ABNORMAL HIGH (ref 4.0–10.5)
nRBC: 0 % (ref 0.0–0.2)

## 2021-07-12 LAB — TYPE AND SCREEN
ABO/RH(D): O POS
Antibody Screen: NEGATIVE

## 2021-07-12 LAB — RPR: RPR Ser Ql: NONREACTIVE

## 2021-07-12 MED ORDER — DIBUCAINE (PERIANAL) 1 % EX OINT: 1.0000 "application " | TOPICAL_OINTMENT | CUTANEOUS | Status: DC | PRN

## 2021-07-12 MED ORDER — PRENATAL MULTIVITAMIN CH
1.0000 | ORAL_TABLET | Freq: Every day | ORAL | Status: DC
  Administered 2021-07-12: 1 via ORAL
  Filled 2021-07-12: qty 1

## 2021-07-12 MED ORDER — DIPHENHYDRAMINE HCL 25 MG PO CAPS: 25.0000 mg | ORAL_CAPSULE | Freq: Four times a day (QID) | ORAL | Status: DC | PRN

## 2021-07-12 MED ORDER — ONDANSETRON HCL 4 MG PO TABS
4.0000 mg | ORAL_TABLET | ORAL | Status: DC | PRN
Start: 2021-07-12 — End: 2021-07-13

## 2021-07-12 MED ORDER — WITCH HAZEL-GLYCERIN EX PADS: 1.0000 "application " | MEDICATED_PAD | CUTANEOUS | Status: DC | PRN

## 2021-07-12 MED ORDER — ACETAMINOPHEN 325 MG PO TABS
650.0000 mg | ORAL_TABLET | ORAL | Status: DC | PRN
Start: 2021-07-12 — End: 2021-07-13

## 2021-07-12 MED ORDER — FLEET ENEMA 7-19 GM/118ML RE ENEM: 1.0000 | ENEMA | Freq: Every day | RECTAL | Status: DC | PRN

## 2021-07-12 MED ORDER — SIMETHICONE 80 MG PO CHEW: 80.0000 mg | CHEWABLE_TABLET | ORAL | Status: DC | PRN

## 2021-07-12 MED ORDER — BISACODYL 10 MG RE SUPP: 10.0000 mg | Freq: Every day | RECTAL | Status: DC | PRN

## 2021-07-12 MED ORDER — TETANUS-DIPHTH-ACELL PERTUSSIS 5-2.5-18.5 LF-MCG/0.5 IM SUSY: 0.5000 mL | PREFILLED_SYRINGE | Freq: Once | INTRAMUSCULAR | Status: DC

## 2021-07-12 MED ORDER — OXYCODONE HCL 5 MG PO TABS: 10.0000 mg | ORAL_TABLET | ORAL | Status: DC | PRN

## 2021-07-12 MED ORDER — OXYCODONE HCL 5 MG PO TABS: 5.0000 mg | ORAL_TABLET | ORAL | Status: DC | PRN

## 2021-07-12 MED ORDER — COCONUT OIL OIL: 1.0000 "application " | TOPICAL_OIL | Status: DC | PRN

## 2021-07-12 MED ORDER — ONDANSETRON HCL 4 MG/2ML IJ SOLN: 4.0000 mg | INTRAMUSCULAR | Status: DC | PRN

## 2021-07-12 MED ORDER — BENZOCAINE-MENTHOL 20-0.5 % EX AERO: 1.0000 "application " | INHALATION_SPRAY | CUTANEOUS | Status: DC | PRN

## 2021-07-12 MED ORDER — DOCUSATE SODIUM 100 MG PO CAPS: 100.0000 mg | ORAL_CAPSULE | Freq: Two times a day (BID) | ORAL | Status: DC

## 2021-07-12 MED ORDER — IBUPROFEN 600 MG PO TABS
600.0000 mg | ORAL_TABLET | Freq: Four times a day (QID) | ORAL | Status: DC
  Administered 2021-07-12 – 2021-07-13 (×5): 600 mg via ORAL
  Filled 2021-07-12 (×5): qty 1

## 2021-07-12 NOTE — Progress Notes (Signed)
Patient is doing well.  She is ambulating, voiding, tolerating PO.  Pain control is good.  Lochia is appropriate ? ?Vitals:  ? 07/12/21 0101 07/12/21 0202 07/12/21 0300 07/12/21 0657  ?BP: 118/61 117/62 115/66 120/70  ?Pulse: (!) 53 (!) 57 81 (!) 57  ?Resp:  18 18 18   ?Temp:  97.6 ?F (36.4 ?C) 98.4 ?F (36.9 ?C) 98.4 ?F (36.9 ?C)  ?TempSrc:  Oral Oral Oral  ?SpO2:  100% 100% 100%  ?Weight:      ?Height:      ? ? ?NAD ?Fundus firm ?Ext: trace edema bilaterally ? ?Lab Results  ?Component Value Date  ? WBC 16.3 (H) 07/12/2021  ? HGB 10.4 (L) 07/12/2021  ? HCT 31.5 (L) 07/12/2021  ? MCV 86.1 07/12/2021  ? PLT 172 07/12/2021  ? ? ?--/--/O POS (05/05 2314)/RImmune ? ?A/P 26 y.o. [id number] PPD#0. ?Routine care.   ?Plan for circumcision likely tomorrow--baby too young at this time ?Expect d/c tomorrow.   ? ?[place] ? ?

## 2021-07-12 NOTE — Lactation Note (Signed)
This note was copied from a baby's chart. ?Lactation Consultation Note ?Mom had baby all ready on the breast when LC came into rm. Baby came off and LC assisted baby back on. ?Mom stated she only BF her now 26 yr old for 1 day because he lost so much weight so she just gave formula. ?Mom stated she has been leaking so she hopes this baby will want to BF better. ?Praised mom for all ready having baby on the breast. ?Will f/u on MBU. ? ?Patient Name: Christie Hart ?Today's Date: 07/12/2021 ?Reason for consult: L&D Initial assessment;Term ?Age:38 hours ? ?Maternal Data ?  ? ?Feeding ?  ? ?LATCH Score ?Latch: Grasps breast easily, tongue down, lips flanged, rhythmical sucking. ? ?Audible Swallowing: None ? ?Type of Nipple: Everted at rest and after stimulation ? ?Comfort (Breast/Nipple): Soft / non-tender ? ?Hold (Positioning): Assistance needed to correctly position infant at breast and maintain latch. ? ?LATCH Score: 7 ? ? ?Lactation Tools Discussed/Used ?  ? ?Interventions ?Interventions: Assisted with latch ? ?Discharge ?  ? ?Consult Status ?Consult Status: Follow-up from L&D ?Date: 07/12/21 ?Follow-up type: In-patient ? ? ? ?Charyl Dancer ?07/12/2021, 1:06 AM ? ? ? ?

## 2021-07-12 NOTE — Discharge Summary (Signed)
? ?  Postpartum Discharge Summary ? ?Date of Service updated 07/13/21 ? ?   ?Patient Name: Christie Hart ?DOB: 1995/07/23 ?MRN: [id number] ? ?Date of admission: 07/11/2021 ?Delivery date:07/12/2021  ?Delivering provider: Derl Barrow E  ?Date of discharge: 07/13/2021 ? ?Admitting diagnosis: Uterine contractions [O47.9] ?Intrauterine pregnancy: [redacted]w[redacted]d     ?Secondary diagnosis:  Principal Problem: ?  Uterine contractions ? ?Additional problems: None    ?Discharge diagnosis: Term Pregnancy Delivered                                              ?Post partum procedures: n/a ?Augmentation: N/A ?Complications: None ? ?Hospital course: Onset of Labor With Vaginal Delivery      ?26 y.o. yo [id number] at [redacted]w[redacted]d was admitted in Active Labor on 07/11/2021. Patient had an uncomplicated labor course as follows:  ?Membrane Rupture Time/Date: 12:04 AM ,07/12/2021   ?Delivery Method:Vaginal, Spontaneous  ?Episiotomy: None  ?Lacerations:  None  ?Patient had an uncomplicated postpartum course.  She is ambulating, tolerating a regular diet, passing flatus, and urinating well. Patient is discharged home in stable condition on 07/13/21. ? ?Newborn Data: ?Birth date:07/12/2021  ?Birth time:12:08 AM  ?Gender:Female  ?Living status:Living  ?Apgars:9 ,9  ?Weight:3800 g  ? ? ?Physical exam  ?Vitals:  ? 07/12/21 1115 07/12/21 1540 07/12/21 2026 07/13/21 0519  ?BP: 123/70 113/62 115/62 114/62  ?Pulse: 62 66 (!) 54 (!) 58  ?Resp: 16 20 16 16   ?Temp: 98.4 ?F (36.9 ?C) 99 ?F (37.2 ?C) 98.4 ?F (36.9 ?C) 98.5 ?F (36.9 ?C)  ?TempSrc: Oral Oral Oral Oral  ?SpO2: 100% 100%    ?Weight:      ?Height:      ? ?General: alert, cooperative, and no distress ?Lochia: appropriate ?Uterine Fundus: firm ?Incision: N/A ?DVT Evaluation: No evidence of DVT seen on physical exam. ?Labs: ?Lab Results  ?Component Value Date  ? WBC 16.3 (H) 07/12/2021  ? HGB 10.4 (L) 07/12/2021  ? HCT 31.5 (L) 07/12/2021  ? MCV 86.1 07/12/2021  ? PLT 172 07/12/2021  ? ? ?  Latest Ref Rng & Units  05/16/2020  ?  1:03 PM  ?CMP  ?Glucose 70 - 99 mg/dL 536    ?BUN 6 - 20 mg/dL <5    ?Creatinine 0.44 - 1.00 mg/dL 4.68    ?Sodium 135 - 145 mmol/L 139    ?Potassium 3.5 - 5.1 mmol/L 3.9    ?Chloride 98 - 111 mmol/L 108    ?CO2 22 - 32 mmol/L 26    ?Calcium 8.9 - 10.3 mg/dL 9.1    ?Total Protein 6.5 - 8.1 g/dL 6.5    ?Total Bilirubin 0.3 - 1.2 mg/dL 0.5    ?Alkaline Phos 38 - 126 U/L 57    ?AST 15 - 41 U/L 14    ?ALT 0 - 44 U/L 9    ? ?Edinburgh Score: ? ?  07/12/2021  ? 11:15 AM  ?Inocente Salles Postnatal Depression Scale Screening Tool  ?I have been able to laugh and see the funny side of things. 0  ?I have looked forward with enjoyment to things. 0  ?I have blamed myself unnecessarily when things went wrong. 0  ?I have been anxious or worried for no good reason. 0  ?I have felt scared or panicky for no good reason. 0  ?Things have been getting on top of me. 0  ?  I have been so unhappy that I have had difficulty sleeping. 0  ?I have felt sad or miserable. 0  ?I have been so unhappy that I have been crying. 0  ?The thought of harming myself has occurred to me. 0  ?Edinburgh Postnatal Depression Scale Total 0  ? ? ? ? ?After visit meds:  ?Allergies as of 07/13/2021   ?No Known Allergies ?  ? ?  ?Medication List  ?  ? ?TAKE these medications   ? ?prenatal multivitamin Tabs tablet ?Take 1 tablet by mouth daily at 12 noon. ?  ? ?  ? ? ? ?Discharge home in stable condition ?Infant Feeding: Breast ?Infant Disposition:home with mother ?Discharge instruction: per After Visit Summary and Postpartum booklet. ?Activity: Advance as tolerated. Pelvic rest for 6 weeks.  ?Diet: routine diet ?Anticipated Birth Control: Unsure ?Postpartum Appointment:4 weeks ?Additional Postpartum F/U:  n/a ?Future Appointments: ?No future appointments. ? ?Follow up Visit: ? Follow-up Information   ? ? Rowland Lathe, MD Follow up in 4 week(s).   ?Specialty: Obstetrics and Gynecology ?Contact information: ?[address] ?[address] ?[place]  [address] ?[phone] ? ? ?  ?  ? ?  ?  ? ?  ? ? ? ?  ? ?07/13/2021 ?Tamey Wanek GEFFEL Carlis Abbott, MD ? ? ?

## 2021-07-12 NOTE — Lactation Note (Signed)
This note was copied from a baby's chart. ?Lactation Consultation Note ? ?Patient Name: Christie Hart ?Today's Date: 07/12/2021 ?Reason for consult: Initial assessment;Mother's request;Difficult latch;Term;Breastfeeding assistance ?Age:26 hours ? ?LC assisted latching at the breast after giving 4 ml colostrum on a spoon.  ?Infant still latched and feeding at the end of the visit.  ? ?Mom requested use of DEBP. Arial set up pump, not able to check flange size since working on latching infant. LC to alert RN to assist with getting mother pumping after she is done breastfeeding.  ? ?Plan 1. To feed based on cues 8-12x 24hr period. Mom to offer breasts and look for signs of milk transfer.  ?2. Mom to supplement with EBM 5-7 ml per feeding with a spoon or pace bottle feeding with slow flow nipple.  ?3. DEBP q 3hrs for 15 min  ?4. I and O sheet reviewed.  ? ?Maternal Data ?Has patient been taught Hand Expression?: Yes ?How long did the patient breastfeed?: 2 months trouble milk supply and transfer, supplemented with formula ? ?Feeding ?Mother's Current Feeding Choice: Breast Milk ? ?LATCH Score ?Latch: Repeated attempts needed to sustain latch, nipple held in mouth throughout feeding, stimulation needed to elicit sucking reflex. ? ?Audible Swallowing: Spontaneous and intermittent ? ?Type of Nipple: Everted at rest and after stimulation ? ?Comfort (Breast/Nipple): Soft / non-tender ? ?Hold (Positioning): Assistance needed to correctly position infant at breast and maintain latch. ? ?LATCH Score: 8 ? ? ?Lactation Tools Discussed/Used ?Tools: Pump;Flanges ?Flange Size: 24 ?Breast pump type: Double-Electric Breast Pump ?Pump Education: Setup, frequency, and cleaning;Milk Storage ?Reason for Pumping: increase stimulation ?Pumping frequency: every 3 hrs for 15 min ? ?Interventions ?Interventions: Breast feeding basics reviewed;Assisted with latch;Skin to skin;Breast massage;Hand express;Breast compression;Adjust  position;Support pillows;Position options;Expressed milk;DEBP;Education;Pace feeding;LC Magazine features editor;Infant Driven Feeding Algorithm education ? ?Discharge ?Pump: DEBP;Personal (Mom medela electric pump at home) ? ?Consult Status ?Consult Status: Follow-up ?Date: 07/13/21 ?Follow-up type: In-patient ? ? ? ?Christie Hapke  Hart ?07/12/2021, 1:44 PM ? ? ? ?

## 2021-07-13 NOTE — Progress Notes (Signed)
Patient is doing well.  She is ambulating, voiding, tolerating PO.  Pain control is good.  Lochia is appropriate ? ?Vitals:  ? 07/12/21 1115 07/12/21 1540 07/12/21 2026 07/13/21 0519  ?BP: 123/70 113/62 115/62 114/62  ?Pulse: 62 66 (!) 54 (!) 58  ?Resp: 16 20 16 16   ?Temp: 98.4 ?F (36.9 ?C) 99 ?F (37.2 ?C) 98.4 ?F (36.9 ?C) 98.5 ?F (36.9 ?C)  ?TempSrc: Oral Oral Oral Oral  ?SpO2: 100% 100%    ?Weight:      ?Height:      ? ? ?NAD ?Fundus firm ?Ext: trace edema bilaterally ? ?Lab Results  ?Component Value Date  ? WBC 16.3 (H) 07/12/2021  ? HGB 10.4 (L) 07/12/2021  ? HCT 31.5 (L) 07/12/2021  ? MCV 86.1 07/12/2021  ? PLT 172 07/12/2021  ? ? ?--/--/O POS (05/05 2314)/RImmune ? ?A/P 26 y.o. 22 PPD#1. ?Routine care.   ?Meeting all goals.  Discharge to home today. ? ?Desires circumcision.   Discussed r/b/a of the procedure.  Reviewed that circumcision is an elective surgical procedure and not considered medically necessary.  Reviewed the risks of the procedure including the risk of infection, bleeding, damage to surrounding structures, including scrotum, shaft, urethra and head of penis, and an undesired cosmetic effect requiring additional procedures for revision.  Consent signed.  ? ? ?Rickayla Wieland GEFFEL Gevork Ayyad ? ?

## 2021-07-13 NOTE — Lactation Note (Signed)
This note was copied from a baby's chart. ?Lactation Consultation Note ? ?Patient Name: Christie Hart ?Today's Date: 07/13/2021 ?Reason for consult: Follow-up assessment;Term ?Age:26 hours ? ?P2 mother whose infant is now 44 hours old.  This is a term baby at 40+0 weeks.  Mother's current feeding preference is breast/formula. ? ?Baby has been primarily formula feeding.  Reviewed feeding plan for after discharge.  Allowed time for questions.  Mother had no questions/concerns at this time.  Encouraged feeding 30+ mls/feeding.  Mother ready for discharge; alerted RN. ? ?Family has our OP phone number for any questions after discharge.  Mother appreciative. ? ? ?Maternal Data ?  ? ?Feeding ?Mother's Current Feeding Choice: Breast Milk and Formula ?Nipple Type: Slow - flow ? ?LATCH Score ?  ? ?  ? ?  ? ?  ? ?  ? ?  ? ? ?Lactation Tools Discussed/Used ?  ? ?Interventions ?  ? ?Discharge ?Discharge Education: Engorgement and breast care ? ?Consult Status ?Consult Status: Complete ?Date: 07/14/21 ?Follow-up type: Call as needed ? ? ? ?Christie Hart ?07/13/2021, 10:06 AM ? ? ? ?

## 2021-07-13 NOTE — Clinical Social Work Maternal (Signed)
?CLINICAL SOCIAL WORK MATERNAL/CHILD NOTE ? ?Patient Details  ?Name: Christie Hart ?MRN: [id number] ?Date of Birth: 11/21/1995 ? ?Date:  07/13/2021 ? ?Clinical Social Worker Initiating Note:  Jenie Parish, LCSW Date/Time: Initiated:  07/13/21/1051    ? ?Child's Name:  Breawna Cratty  ? ?Biological Parents:  Mother, Father (Father: Timothy Parrish)  ? ?Need for Interpreter:  None  ? ?Reason for Referral:  Behavioral Health Concerns  ? ?Address:  [address] ?[address] ?[place] [address]  ?  ?Phone number:  [phone] (home)    ? ?Additional phone number:  ? ?Household Members/Support Persons (HM/SP):   Household Member/Support Person 1, Household Member/Support Person 2 ? ? ?HM/SP Name Relationship DOB or Age  ?HM/SP -1 Timothy Parrish FOB    ?HM/SP -2 [name] son 05/14/2019  ?HM/SP -3        ?HM/SP -4        ?HM/SP -5        ?HM/SP -6        ?HM/SP -7        ?HM/SP -8        ? ? ?Natural Supports (not living in the home):  Parent  ? ?Professional Supports: None  ? ?Employment: Full-time  ? ?Type of Work: Stocker and Receiver at [employer]  ? ?Education:  High school graduate  ? ?Homebound arranged:   ? ?Financial Resources:  Medicaid  ? ?Other Resources:  WIC, Food Stamps    ? ?Cultural/Religious Considerations Which May Impact Care:   ? ?Strengths:  Ability to meet basic needs  , Pediatrician chosen, Home prepared for child    ? ?Psychotropic Medications:        ? ?Pediatrician:    [place] area ? ?Pediatrician List:  ? ?[place]    ?[place] Other ([facility] [place])  ? County    ?[place]    ?[place]    ?[place]    ? ? ?Pediatrician Fax Number:   ? ?Risk Factors/Current Problems:  Mental Health Concerns    ? ?Cognitive State:  Able to Concentrate  , Alert  , Goal Oriented  , Insightful  , Linear Thinking    ? ?Mood/Affect:  Comfortable  , Calm  , Interested  , Relaxed    ? ?CSW Assessment: CSW met with MOB at bedside  to complete psychosocial assessment. CSW introduced self and explained reason for consult. MOB was welcoming and remained engaged during assessment. MOB reported that she resides with FOB and older son. 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 MOB reported that she receives both WIC and food stamps. MOB reported that they have all items needed to care for infant including a car seat and basinet. CSW inquired about MOB's support system, MOB reported that her mom is a support.  ? ?CSW inquired about MOB's mental health history. MOB reported that she was diagnosed with depression and anxiety around 13 or 26 years old. MOB denied any current depression symptoms and reported last having symptoms after having her son, noting she experienced postpartum depression. MOB reported that she learned to cope really well with her depression, noting she stopped taking medication years ago. CSW inquired about MOB's coping skills, MOB reported that she eases her mind by doing something else, talks to her mom, and her boyfriend is helpful. MOB denied any current anxiety symptoms, noting she has normal anxiety surrounding being a mom but   nothing that interferes with her everyday life. MOB reported that she was diagnosed with Bipolar when she was in middle school. MOB described her Bipolar Disorder as having mood swings and denied any current symptoms. MOB reported that she is not taking any medication and has learned to cope with this as well. MOB endorsed a history of postpartum depression, noting it lasted about 2 weeks. MOB described being sad and crying during this time. MOB reported that she did not take any medication and the symptoms subsided on their own. CSW and MOB discussed the possibility of MOB experiencing baby blues versus postpartum depression. MOB denied any additional mental health history. CSW inquired about how MOB was feeling emotionally since giving birth, MOB reported that  she was feeling pretty good. MOB shared that she is nervous about the adjustment with her son at home. CSW acknowledged, normalized, and validated MOB's feelings. MOB presented calm and did not demonstrate any acute mental health signs/symptoms. CSW assessed for safety, MOB denied SI, HI, and domestic violence.  ? ?CSW provided education regarding the baby blues period vs. perinatal mood disorders, discussed treatment and gave resources for mental health follow up if concerns arise.  CSW recommends self-evaluation during the postpartum time period using the New Mom Checklist from Postpartum Progress and encouraged MOB to contact a medical professional if symptoms are noted at any time.  CSW asked if MOB was interested in any therapy resources, MOB declined.  ? ?CSW provided review of Sudden Infant Death Syndrome (SIDS) precautions.   ? ?CSW identifies no further need for intervention and no barriers to discharge at this time. ? ? ?CSW Plan/Description:  No Further Intervention Required/No Barriers to Discharge, Sudden Infant Death Syndrome (SIDS) Education, Perinatal Mood and Anxiety Disorder (PMADs) Education  ? ? ?Kaeleigh Westendorf L Tyneka Scafidi, LCSW ?07/13/2021, 10:53 AM ?

## 2021-07-15 LAB — SURGICAL PATHOLOGY

## 2021-07-17 ENCOUNTER — Inpatient Hospital Stay (HOSPITAL_COMMUNITY)
Admission: AD | Admit: 2021-07-17 | Payer: Medicaid Other | Source: Home / Self Care | Admitting: Obstetrics and Gynecology

## 2021-07-17 ENCOUNTER — Inpatient Hospital Stay (HOSPITAL_COMMUNITY): Payer: Medicaid Other

## 2021-07-22 ENCOUNTER — Telehealth (HOSPITAL_COMMUNITY): Payer: Self-pay | Admitting: *Deleted

## 2021-07-22 NOTE — Telephone Encounter (Signed)
Mom reports feeling good. No concerns about herself at this time. EPDS=0 [facility] score=0) ?Mom reports baby is doing well. Feeding, peeing, and pooping without difficulty. Safe sleep reviewed. Mom reports no concerns about baby at present. ? ?Duffy Rhody, RN 07-22-2021 at 9:44am ?

## 2021-09-18 ENCOUNTER — Encounter (HOSPITAL_COMMUNITY): Payer: Self-pay

## 2021-09-18 ENCOUNTER — Other Ambulatory Visit: Payer: Self-pay

## 2021-09-18 ENCOUNTER — Emergency Department (HOSPITAL_COMMUNITY)
Admission: EM | Admit: 2021-09-18 | Discharge: 2021-09-18 | Disposition: A | Discharge status: Home / Self Care | Payer: Medicaid Other | Attending: Emergency Medicine | Admitting: Emergency Medicine

## 2021-09-18 DIAGNOSIS — Y92009 Unspecified place in unspecified non-institutional (private) residence as the place of occurrence of the external cause: Secondary | ICD-10-CM | POA: Diagnosis not present

## 2021-09-18 DIAGNOSIS — S79922A Unspecified injury of left thigh, initial encounter: Secondary | ICD-10-CM | POA: Diagnosis present

## 2021-09-18 DIAGNOSIS — S71112A Laceration without foreign body, left thigh, initial encounter: Secondary | ICD-10-CM | POA: Insufficient documentation

## 2021-09-18 DIAGNOSIS — W260XXA Contact with knife, initial encounter: Secondary | ICD-10-CM | POA: Diagnosis not present

## 2021-09-18 MED ORDER — LIDOCAINE HCL (PF) 1 % IJ SOLN
5.0000 mL | Freq: Once | INTRAMUSCULAR | Status: AC
  Administered 2021-09-18: 5 mL via INTRADERMAL
  Filled 2021-09-18: qty 30

## 2021-09-18 NOTE — Discharge Instructions (Addendum)
Keep sutures clean and dry. Follow-up with PCP or urgent care for sutures removal in about 1 week. Return here for any new/acute changes.

## 2021-09-18 NOTE — ED Triage Notes (Signed)
Pt presents to ED from home with c/o left thigh laceration. Pt states she tripped and fell onto the dishwasher tonight while a knife was sticking up. Pt has large gash to left thigh, bleeding controlled at this time.

## 2021-09-18 NOTE — ED Provider Notes (Signed)
[facility]-EMERGENCY DEPT Provider Note   CSN: [id number] Arrival date & time: 09/18/21  0241     History  Chief Complaint  Patient presents with   Extremity Laceration    Christie Hart is a 26 y.o. female.  The history is provided by the patient and medical records.   26 y.o F here with laceration to left leg.  She tripped at home and fell into the dishwasher, landed on knife that was sticking up in the utensil tray.  Has laceration to left lateral thigh, bleeding controlled.  Tetanus UTD.  Home Medications Prior to Admission medications   Medication Sig Start Date End Date Taking? Authorizing Provider  Prenatal Vit-Fe Fumarate-FA (PRENATAL MULTIVITAMIN) TABS tablet Take 1 tablet by mouth daily at 12 noon.    [provider]      Allergies    Patient has no known allergies.    Review of Systems   Review of Systems  Skin:  Positive for wound.  All other systems reviewed and are negative.   Physical Exam Updated Vital Signs BP 98/85 (BP Location: Left Arm)   Pulse (!) 105   Temp 98.5 F (36.9 C) (Oral)   Resp 18   Ht 5\' 8"  (1.727 m)   Wt 68.9 kg   LMP 10/05/2020   SpO2 100%   BMI 23.11 kg/m   Physical Exam Vitals and nursing note reviewed.  Constitutional:      Appearance: She is well-developed.  HENT:     Head: Normocephalic and atraumatic.  Eyes:     Conjunctiva/sclera: Conjunctivae normal.     Pupils: Pupils are equal, round, and reactive to light.  Cardiovascular:     Rate and Rhythm: Normal rate and regular rhythm.     Heart sounds: Normal heart sounds.  Pulmonary:     Effort: Pulmonary effort is normal. No respiratory distress.     Breath sounds: Normal breath sounds. No rhonchi.  Musculoskeletal:        General: Normal range of motion.     Cervical back: Normal range of motion.     Comments: 1 inch laceration to left lateral thigh, gaping but wound is hemostatic  Skin:    General: Skin is warm and dry.   Neurological:     Mental Status: She is alert and oriented to person, place, and time.     ED Results / Procedures / Treatments   Labs (all labs ordered are listed, but only abnormal results are displayed) Labs Reviewed - No data to display  EKG None  Radiology No results found.  Procedures Procedures    LACERATION REPAIR Performed by: 10/07/2020 Authorized by: Garlon Hatchet Consent: Verbal consent obtained. Risks and benefits: risks, benefits and alternatives were discussed Consent given by: patient Patient identity confirmed: provided demographic data Prepped and Draped in normal sterile fashion Wound explored  Laceration Location: left lateral thigh  Laceration Length: 3cm  No Foreign Bodies seen or palpated  Anesthesia: local infiltration  Local anesthetic: lidocaine 1% without epinephrine  Anesthetic total: 4 ml  Irrigation method: syringe Amount of cleaning: standard  Skin closure: 4-0 prolene  Number of sutures: 3  Technique: simple interrupted  Patient tolerance: Patient tolerated the procedure well with no immediate complications.   Medications Ordered in ED Medications  lidocaine (PF) (XYLOCAINE) 1 % injection 5 mL (5 mLs Intradermal Given 09/18/21 0330)    ED Course/ Medical Decision Making/ A&P  Medical Decision Making Risk Prescription drug management.   26 year old female presenting to the ED with laceration to left lateral thigh after she tripped and fell into the dishwasher on a knife.  Bleeding is controlled on arrival.  Has 1 inch laceration to left lateral thigh.  No signs of infection.  Tetanus is up-to-date.  Wound repaired as above, tolerated well.  Discharge home with wound care instructions and follow-up with urgent care for suture removal in 7 to 10 days.  Can return here for any new or acute changes.  Final Clinical Impression(s) / ED Diagnoses Final diagnoses:  Laceration of left thigh,  initial encounter    Rx / DC Orders ED Discharge Orders     None         Garlon Hatchet, PA-C 09/18/21 0336    Sabas Sous, MD 09/18/21 [phone]

## 2021-09-23 ENCOUNTER — Ambulatory Visit
Admission: EM | Admit: 2021-09-23 | Discharge: 2021-09-23 | Disposition: A | Discharge status: Home / Self Care | Payer: Medicaid Other | Attending: Student | Admitting: Student

## 2021-09-23 DIAGNOSIS — Z973 Presence of spectacles and contact lenses: Secondary | ICD-10-CM | POA: Diagnosis not present

## 2021-09-23 DIAGNOSIS — S0501XA Injury of conjunctiva and corneal abrasion without foreign body, right eye, initial encounter: Secondary | ICD-10-CM | POA: Diagnosis not present

## 2021-09-23 MED ORDER — CIPROFLOXACIN HCL 0.3 % OP SOLN: 1.0000 [drp] | OPHTHALMIC | 0 refills | Status: AC

## 2021-09-23 NOTE — ED Triage Notes (Signed)
Patient states on Friday her right eye was red but it has progressed to irritation, having clear drainage and painful with photosensitivity.

## 2021-09-23 NOTE — ED Provider Notes (Addendum)
[facility]    CSN: [id number] Arrival date & time: 09/23/21  0857      History   Chief Complaint Chief Complaint  Patient presents with   Eye Problem    HPI Christie Hart is a 26 y.o. female presenting with right eye problem.  Christie Hart wears contact lenses, history otherwise noncontributory.  Christie Hart states 5 days ago on 09/19/2021, her right eye became red and irritated.  Since then, the redness got progressively worse, and her eye was so sensitive that Christie Hart could not remove her contact lenses.  Christie Hart woke up today with watering and photophobia.  There was some foreign body sensation on day 1, but this has completely resolved at the time of this visit.  Denies foreign body sensation, eye crusting in the morning, eye pain with movement, injury to eye, vision changes, double vision, new flashes of light or floaters in field of vision.  Denies trauma to the eye.  Her contact lenses have been in place for 5 days - has not removed contacts in 5 days.  Typically Christie Hart uses her contact lenses for 1 month.  Christie Hart does not have glasses.  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 Authorizing Provider  ciprofloxacin (CILOXAN) 0.3 % ophthalmic solution Place 1 drop into both eyes every 4 (four) hours while awake for 7 days. 09/23/21 09/30/21 Yes Rhys Martini, PA-C    Family History Family History  Problem Relation Age of Onset   Diabetes Mother    Hypertension Mother     Social History Social History   Tobacco Use   Smoking status: Never   Smokeless tobacco: Never  Vaping Use   Vaping Use: Never used  Substance Use Topics   Alcohol use: No   Drug use: No     Allergies   Patient has no known allergies.   Review of Systems Review of Systems  Eyes:  Positive for photophobia and redness.  All other systems reviewed and are negative.    Physical Exam Triage Vital Signs ED Triage Vitals  Enc Vitals Group     BP 09/23/21  0914 120/62     Pulse Rate 09/23/21 0914 97     Resp 09/23/21 0919 18     Temp 09/23/21 0914 99 F (37.2 C)     Temp Source 09/23/21 0914 Oral     SpO2 09/23/21 0914 98 %     Weight --      Height --      Head Circumference --      Peak Flow --      Pain Score 09/23/21 0920 8     Pain Loc --      Pain Edu? --      Excl. in GC? --    No data found.  Updated Vital Signs BP 120/62 (BP Location: Left Arm)   Pulse 97   Temp 99 F (37.2 C) (Oral)   Resp 18   LMP 10/05/2020   SpO2 98%   Breastfeeding Unknown   Visual Acuity Right Eye Distance:   Left Eye Distance:   Bilateral Distance:    Right Eye Near:   Left Eye Near:    Bilateral Near:     Visual acuity 20/20 wearing contacts. Checked by rad tech   Physical Exam Vitals reviewed.  Constitutional:      Appearance: Normal appearance.  HENT:     Head: Normocephalic and atraumatic.     Right Ear: Tympanic  membrane, ear canal and external ear normal. There is no impacted cerumen.     Left Ear: Tympanic membrane, ear canal and external ear normal. There is no impacted cerumen.     Nose: Nose normal. No congestion.     Mouth/Throat:     Pharynx: Oropharynx is clear. No posterior oropharyngeal erythema.  Eyes:     General: Lids are normal. Lids are everted, no foreign bodies appreciated. Vision grossly intact. Gaze aligned appropriately. No visual field deficit.       Right eye: No foreign body, discharge or hordeolum.        Left eye: No foreign body, discharge or hordeolum.     Extraocular Movements: Extraocular movements intact.     Right eye: Normal extraocular motion and no nystagmus.     Left eye: Normal extraocular motion and no nystagmus.     Conjunctiva/sclera:     Right eye: Right conjunctiva is injected. No chemosis, exudate or hemorrhage.    Left eye: Left conjunctiva is injected. No chemosis, exudate or hemorrhage.    Pupils: Pupils are equal, round, and reactive to light.     Right eye: Corneal abrasion and fluorescein uptake present. Seidel exam negative.     Left eye: No corneal abrasion or fluorescein uptake. Seidel exam negative.    Visual Fields: Right eye visual fields normal and left eye visual fields normal.     Comments: The right conjunctivae are diffusely injected. The left conjunctivae are mildly injected. R fluorescein uptake below iris. No ulceration present. PERRLA, EOMI without pain. No orbital tenderness. No proptosis.   Cardiovascular:     Rate and Rhythm: Normal rate and regular rhythm.     Heart sounds: Normal heart sounds.  Pulmonary:     Effort: Pulmonary effort is normal.     Breath sounds: Normal breath sounds.  Neurological:     General: No focal deficit present.     Mental Status: Christie Hart is alert.  Psychiatric:        Mood and Affect: Mood normal.        Behavior: Behavior normal.  Thought Content: Thought content normal.        Judgment:  Judgment normal.      UC Treatments / Results  Labs (all labs ordered are listed, but only abnormal results are displayed) Labs Reviewed - No data to display  EKG   Radiology No results found.  Procedures Procedures (including critical care time)  Medications Ordered in UC Medications - No data to display  Initial Impression / Assessment and Plan / UC Course  I have reviewed the triage vital signs and the nursing notes.  Pertinent labs & imaging results that were available during my care of the patient were reviewed by me and considered in my medical decision making (see chart for details).     This patient is a very pleasant 26 y.o. year old female presenting with R corneal abrasion that has progressed into acute infection.  Visual acuity intact wearing contacts.  Her contact lenses have been in place for 5 days, Christie Hart has not removed them at all.  Initially with foreign body sensation, this has resolved but now Christie Hart is experiencing photophobia.  There is  fluorescein uptake on exam, but I did not visualize ulceration.  Ciprofloxacin drops prescribed, and do not wear contact lenses again until symptoms resolve!!  Christie Hart will wear the left contact lens to get home, and then Christie Hart will throw this out as well.  If her symptoms get worse on the way and Christie Hart cannot drive safely, Christie Hart verbalizes Christie Hart will pull over.   Final Clinical Impressions(s) / UC Diagnoses   Final diagnoses:  Abrasion of right cornea, initial encounter  Wears contact lenses     Discharge Instructions      -You have a corneal abrasion. A corneal abrasion is a superficial scratch on the clear, protective "window" at the front of the eye (cornea). The cornea can be scratched by contact with dust, dirt, sand, wood shavings, plant matter, metal particles, contact lenses or even the edge of a piece of paper. -Your corneal abrasion has turned into an infection, similar to pinkeye -Ciprofloxacin drops every 4 hours while  awake x4 days -Warm compresses can help with the discomfort. -Do not wear contact lenses until symptoms resolve and you have completed the medication.  -Dispose of any makeup you have worn in the last 5 days  -If your symptoms are getting worse instead of better, like eye pain, vision changes, eye pain with movement, eyelid swelling-follow-up with an ophthalmologist.  Information below, call them to schedule this.      ED Prescriptions     Medication Sig Dispense Auth. Provider   ciprofloxacin (CILOXAN) 0.3 % ophthalmic solution Place 1 drop into both eyes every 4 (four) hours while awake for 7 days. 1.8 mL Rhys Martini, PA-C      PDMP not reviewed this encounter.   Rhys Martini, PA-C 09/23/21 1051    Rhys Martini, PA-C 09/23/21 1116

## 2021-09-23 NOTE — Discharge Instructions (Addendum)
-  You have a corneal abrasion. A corneal abrasion is a superficial scratch on the clear, protective "window" at the front of the eye (cornea). The cornea can be scratched by contact with dust, dirt, sand, wood shavings, plant matter, metal particles, contact lenses or even the edge of a piece of paper. -Your corneal abrasion has turned into an infection, similar to pinkeye -Ciprofloxacin drops every 4 hours while awake x4 days -Warm compresses can help with the discomfort. -Do not wear contact lenses until symptoms resolve and you have completed the medication.  -Dispose of any makeup you have worn in the last 5 days  -If your symptoms are getting worse instead of better, like eye pain, vision changes, eye pain with movement, eyelid swelling-follow-up with an ophthalmologist.  Information below, call them to schedule this.

## 2021-09-23 NOTE — ED Notes (Signed)
Patient states on Friday her Right eye was red but it has progressed to irritation, and having clear drainage and painful, sensitive lights  Pain 8/10

## 2021-09-28 ENCOUNTER — Encounter (HOSPITAL_COMMUNITY): Payer: Self-pay | Admitting: *Deleted

## 2021-09-28 ENCOUNTER — Other Ambulatory Visit: Payer: Self-pay

## 2021-09-28 ENCOUNTER — Emergency Department (HOSPITAL_COMMUNITY)
Admission: EM | Admit: 2021-09-28 | Discharge: 2021-09-28 | Disposition: A | Discharge status: Home / Self Care | Payer: Medicaid Other | Attending: Emergency Medicine | Admitting: Emergency Medicine

## 2021-09-28 DIAGNOSIS — B852 Pediculosis, unspecified: Secondary | ICD-10-CM | POA: Insufficient documentation

## 2021-09-28 DIAGNOSIS — Z4802 Encounter for removal of sutures: Secondary | ICD-10-CM | POA: Insufficient documentation

## 2021-09-28 MED ORDER — PERMETHRIN 1 % EX LOTN: 1.0000 | TOPICAL_LOTION | Freq: Once | CUTANEOUS | 1 refills | Status: AC

## 2021-09-28 NOTE — ED Triage Notes (Signed)
Patient presents for suture removal.

## 2021-09-28 NOTE — Discharge Instructions (Addendum)
Continue to use Neosporin over your leg.  I have sent the hair cream to the pharmacy for you to use as needed.  Call and schedule a primary care appointment with the office attached to these papers as well.  They will be able to do yearly physicals and see you for nonemergent conditions

## 2021-09-28 NOTE — ED Provider Notes (Signed)
[facility]-EMERGENCY DEPT Provider Note   CSN: [id number] Arrival date & time: 09/28/21  1428     History No chief complaint on file.   JOURI THREAT is a 26 y.o. female presenting for suture removal.  She fell onto a knife last week and has had no complications with her sutures.  No fevers or chills or discharge.  Also reports that her son has had head lice that she has tried multiple things over-the-counter but needs a prescription for them both.  HPI     Home Medications Prior to Admission medications   Medication Sig Start Date End Date Taking? Authorizing Provider  permethrin (ELIMITE) 1 % lotion Apply 1 Application topically once for 1 dose. Shampoo, rinse and towel dry hair, saturate hair and scalp with permethrin. Rinse after 10 min; repeat in 1 week if needed 09/28/21 09/28/21 Yes Mackey Varricchio A, PA-C  ciprofloxacin (CILOXAN) 0.3 % ophthalmic solution Place 1 drop into both eyes every 4 (four) hours while awake for 7 days. 09/23/21 09/30/21  Rhys Martini, PA-C      Allergies    Patient has no known allergies.    Review of Systems   Review of Systems  Physical Exam Updated Vital Signs BP 119/63 (BP Location: Right Arm)   Pulse 79   Temp 99.5 F (37.5 C) (Oral)   Resp 16   LMP 10/05/2020   SpO2 100%  Physical Exam Vitals and nursing note reviewed.  Constitutional:      Appearance: Normal appearance.  HENT:     Head: Normocephalic and atraumatic.  Eyes:     General: No scleral icterus.    Conjunctiva/sclera: Conjunctivae normal.  Pulmonary:     Effort: Pulmonary effort is normal. No respiratory distress.  Skin:    Findings: No rash.     Comments: Well-healed scar with 3 sutures to the left lateral thigh  Neurological:     Mental Status: She is alert.  Psychiatric:        Mood and Affect: Mood normal.     ED Results / Procedures / Treatments   Labs (all labs ordered are listed, but only abnormal results are  displayed) Labs Reviewed - No data to display  EKG None  Radiology No results found.  Procedures Procedures   Medications Ordered in ED Medications - No data to display  ED Course/ Medical Decision Making/ A&P                           Medical Decision Making Risk OTC drugs.   Patient presenting for suture removal.  Sutures do not appear infected and wound is well-healed.  No discharge from it.  I also will send her medication for her head lice.  We discussed that I am only sending this for her not her son who can follow-up with pediatrician.  She voiced understanding.  Discharged   Final Clinical Impression(s) / ED Diagnoses Final diagnoses:  Lice  Visit for suture removal    Rx / DC Orders ED Discharge Orders          Ordered    permethrin (ELIMITE) 1 % lotion   Once        09/28/21 1500           Results and diagnoses were explained to the patient. Return precautions discussed in full. Patient had no additional questions and expressed complete understanding.   This chart was dictated using voice  recognition software.  Despite best efforts to proofread,  errors can occur which can change the documentation meaning.    Woodroe Chen 09/28/21 1505    Cheryll Cockayne, MD 09/28/21 2040

## 2021-09-28 NOTE — ED Notes (Signed)
Patient given discharge instructions, all questions answered. Patient in possession of all belongings, directed to the discharge area  

## 2021-11-15 ENCOUNTER — Encounter (HOSPITAL_COMMUNITY): Payer: Self-pay | Admitting: *Deleted

## 2021-11-15 ENCOUNTER — Emergency Department (HOSPITAL_COMMUNITY)
Admission: EM | Admit: 2021-11-15 | Discharge: 2021-11-15 | Disposition: A | Discharge status: Home / Self Care | Payer: Medicaid Other | Attending: Emergency Medicine | Admitting: Emergency Medicine

## 2021-11-15 DIAGNOSIS — S3991XA Unspecified injury of abdomen, initial encounter: Secondary | ICD-10-CM | POA: Diagnosis present

## 2021-11-15 DIAGNOSIS — Y92410 Unspecified street and highway as the place of occurrence of the external cause: Secondary | ICD-10-CM | POA: Diagnosis not present

## 2021-11-15 DIAGNOSIS — T148XXA Other injury of unspecified body region, initial encounter: Secondary | ICD-10-CM

## 2021-11-15 DIAGNOSIS — S39011A Strain of muscle, fascia and tendon of abdomen, initial encounter: Secondary | ICD-10-CM | POA: Insufficient documentation

## 2021-11-15 LAB — URINALYSIS, ROUTINE W REFLEX MICROSCOPIC
Bilirubin Urine: NEGATIVE
Glucose, UA: NEGATIVE mg/dL
Hgb urine dipstick: NEGATIVE
Ketones, ur: NEGATIVE mg/dL
Nitrite: NEGATIVE
Protein, ur: 100 mg/dL — AB
Specific Gravity, Urine: 1.015 (ref 1.005–1.030)
pH: 7 (ref 5.0–8.0)

## 2021-11-15 LAB — CBC WITH DIFFERENTIAL/PLATELET
Abs Immature Granulocytes: 0.04 10*3/uL (ref 0.00–0.07)
Basophils Absolute: 0.1 10*3/uL (ref 0.0–0.1)
Basophils Relative: 0 %
Eosinophils Absolute: 0 10*3/uL (ref 0.0–0.5)
Eosinophils Relative: 0 %
HCT: 45.2 % (ref 36.0–46.0)
Hemoglobin: 14.8 g/dL (ref 12.0–15.0)
Immature Granulocytes: 0 %
Lymphocytes Relative: 10 %
Lymphs Abs: 1.1 10*3/uL (ref 0.7–4.0)
MCH: 28.8 pg (ref 26.0–34.0)
MCHC: 32.7 g/dL (ref 30.0–36.0)
MCV: 88.1 fL (ref 80.0–100.0)
Monocytes Absolute: 0.7 10*3/uL (ref 0.1–1.0)
Monocytes Relative: 7 %
Neutro Abs: 9.2 10*3/uL — ABNORMAL HIGH (ref 1.7–7.7)
Neutrophils Relative %: 83 %
Platelets: 288 10*3/uL (ref 150–400)
RBC: 5.13 MIL/uL — ABNORMAL HIGH (ref 3.87–5.11)
RDW: 13.7 % (ref 11.5–15.5)
WBC: 11.2 10*3/uL — ABNORMAL HIGH (ref 4.0–10.5)
nRBC: 0 % (ref 0.0–0.2)

## 2021-11-15 LAB — LIPASE, BLOOD: Lipase: 32 U/L (ref 11–51)

## 2021-11-15 LAB — COMPREHENSIVE METABOLIC PANEL
ALT: 24 U/L (ref 0–44)
AST: 38 U/L (ref 15–41)
Albumin: 4.2 g/dL (ref 3.5–5.0)
Alkaline Phosphatase: 50 U/L (ref 38–126)
Anion gap: 7 (ref 5–15)
BUN: 6 mg/dL (ref 6–20)
CO2: 21 mmol/L — ABNORMAL LOW (ref 22–32)
Calcium: 9.1 mg/dL (ref 8.9–10.3)
Chloride: 111 mmol/L (ref 98–111)
Creatinine, Ser: 0.96 mg/dL (ref 0.44–1.00)
GFR, Estimated: >60 mL/min (ref >60)
Glucose, Bld: 85 mg/dL (ref 70–99)
Potassium: 4.5 mmol/L (ref 3.5–5.1)
Sodium: 139 mmol/L (ref 135–145)
Total Bilirubin: 1.3 mg/dL — ABNORMAL HIGH (ref 0.3–1.2)
Total Protein: 7.2 g/dL (ref 6.5–8.1)

## 2021-11-15 LAB — PREGNANCY, URINE: Preg Test, Ur: NEGATIVE

## 2021-11-15 MED ORDER — ACETAMINOPHEN 325 MG PO TABS: 650.0000 mg | ORAL_TABLET | Freq: Once | ORAL | Status: AC

## 2021-11-15 MED ORDER — ACETAMINOPHEN 325 MG PO TABS
ORAL_TABLET | ORAL | Status: AC
  Administered 2021-11-15: 650 mg via ORAL
  Filled 2021-11-15: qty 2
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
## 2021-11-15 NOTE — ED Notes (Signed)
Lab states 15 more minutes until CMP is completed

## 2021-11-15 NOTE — ED Notes (Signed)
Mother ambulatory to bathroom without assistance.
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
## 2021-11-15 NOTE — ED Notes (Signed)
Provided at bedside performing FAST scan. Patient is alert, appropriate, interactive. No obvious signs of distress noted.

## 2021-11-30 ENCOUNTER — Emergency Department (HOSPITAL_COMMUNITY): Payer: Medicaid Other

## 2021-11-30 ENCOUNTER — Other Ambulatory Visit: Payer: Self-pay

## 2021-11-30 ENCOUNTER — Emergency Department (HOSPITAL_COMMUNITY)
Admission: EM | Admit: 2021-11-30 | Discharge: 2021-12-01 | Disposition: A | Discharge status: Home / Self Care | Payer: Medicaid Other | Source: Home / Self Care | Attending: Emergency Medicine | Admitting: Emergency Medicine

## 2021-11-30 ENCOUNTER — Encounter (HOSPITAL_COMMUNITY): Payer: Self-pay | Admitting: Emergency Medicine

## 2021-11-30 ENCOUNTER — Emergency Department (HOSPITAL_COMMUNITY)
Admission: EM | Admit: 2021-11-30 | Discharge: 2021-11-30 | Discharge status: Against Medical Advice | Payer: Medicaid Other | Attending: Emergency Medicine | Admitting: Emergency Medicine

## 2021-11-30 DIAGNOSIS — S81852A Open bite, left lower leg, initial encounter: Secondary | ICD-10-CM | POA: Insufficient documentation

## 2021-11-30 DIAGNOSIS — S91052A Open bite, left ankle, initial encounter: Secondary | ICD-10-CM | POA: Insufficient documentation

## 2021-11-30 DIAGNOSIS — Z5321 Procedure and treatment not carried out due to patient leaving prior to being seen by health care provider: Secondary | ICD-10-CM | POA: Insufficient documentation

## 2021-11-30 DIAGNOSIS — W540XXA Bitten by dog, initial encounter: Secondary | ICD-10-CM | POA: Insufficient documentation

## 2021-11-30 LAB — COMPREHENSIVE METABOLIC PANEL
ALT: 36 U/L (ref 0–44)
AST: 19 U/L (ref 15–41)
Albumin: 3.9 g/dL (ref 3.5–5.0)
Alkaline Phosphatase: 63 U/L (ref 38–126)
Anion gap: 8 (ref 5–15)
BUN: 9 mg/dL (ref 6–20)
CO2: 23 mmol/L (ref 22–32)
Calcium: 9.6 mg/dL (ref 8.9–10.3)
Chloride: 107 mmol/L (ref 98–111)
Creatinine, Ser: 0.87 mg/dL (ref 0.44–1.00)
GFR, Estimated: >60 mL/min (ref >60)
Glucose, Bld: 98 mg/dL (ref 70–99)
Potassium: 4.1 mmol/L (ref 3.5–5.1)
Sodium: 138 mmol/L (ref 135–145)
Total Bilirubin: 0.5 mg/dL (ref 0.3–1.2)
Total Protein: 7.2 g/dL (ref 6.5–8.1)

## 2021-11-30 LAB — CBC WITH DIFFERENTIAL/PLATELET
Abs Immature Granulocytes: 0.03 10*3/uL (ref 0.00–0.07)
Basophils Absolute: 0.1 10*3/uL (ref 0.0–0.1)
Basophils Relative: 1 %
Eosinophils Absolute: 0.1 10*3/uL (ref 0.0–0.5)
Eosinophils Relative: 1 %
HCT: 39.6 % (ref 36.0–46.0)
Hemoglobin: 13 g/dL (ref 12.0–15.0)
Immature Granulocytes: 0 %
Lymphocytes Relative: 20 %
Lymphs Abs: 1.6 10*3/uL (ref 0.7–4.0)
MCH: 29.1 pg (ref 26.0–34.0)
MCHC: 32.8 g/dL (ref 30.0–36.0)
MCV: 88.6 fL (ref 80.0–100.0)
Monocytes Absolute: 0.7 10*3/uL (ref 0.1–1.0)
Monocytes Relative: 8 %
Neutro Abs: 5.4 10*3/uL (ref 1.7–7.7)
Neutrophils Relative %: 70 %
Platelets: 305 10*3/uL (ref 150–400)
RBC: 4.47 MIL/uL (ref 3.87–5.11)
RDW: 13.6 % (ref 11.5–15.5)
WBC: 7.8 10*3/uL (ref 4.0–10.5)
nRBC: 0 % (ref 0.0–0.2)

## 2021-11-30 LAB — I-STAT BETA HCG BLOOD, ED (MC, WL, AP ONLY): I-stat hCG, quantitative: <5 m[IU]/mL (ref <5)

## 2021-11-30 MED ORDER — AMOXICILLIN-POT CLAVULANATE 875-125 MG PO TABS
1.0000 | ORAL_TABLET | Freq: Once | ORAL | Status: AC
  Administered 2021-12-01: 1 via ORAL
  Filled 2021-11-30: qty 1

## 2021-11-30 MED ORDER — IBUPROFEN 200 MG PO TABS
600.0000 mg | ORAL_TABLET | Freq: Once | ORAL | Status: AC
  Administered 2021-12-01: 600 mg via ORAL
  Filled 2021-11-30: qty 3

## 2021-11-30 NOTE — ED Triage Notes (Signed)
Patient reports dog bite to L lower leg x2 days ago. Seen for same but left before d/c. Reports swelling to leg with drainage from wound. Denies fevers.

## 2021-11-30 NOTE — ED Notes (Signed)
No response for vitals  

## 2021-11-30 NOTE — ED Triage Notes (Signed)
Patient was bitten by sister's dog 2 days ago to the L lower ankle. Per patient the dog was vaccinated for rabies 2 years ago. Bite marks look reddened and irritated. Denies fevers/chills at home. Endorses clear drainage to the wound area.

## 2021-12-01 MED ORDER — AMOXICILLIN-POT CLAVULANATE 875-125 MG PO TABS: 1.0000 | ORAL_TABLET | Freq: Two times a day (BID) | ORAL | 0 refills | Status: DC

## 2021-12-01 MED ORDER — IBUPROFEN 600 MG PO TABS: 600.0000 mg | ORAL_TABLET | Freq: Four times a day (QID) | ORAL | 0 refills | Status: AC | PRN

## 2021-12-01 NOTE — ED Provider Notes (Signed)
[facility] Emergency Department Provider Note MRN:  [id number]  Arrival date & time: 12/01/21     Chief Complaint   Animal Bite   History of Present Illness   Christie Hart is a 26 y.o. year-old female presents to the ED with chief complaint of dog bite.  She states that she was bitten about 2 days ago by her boyfriend's pet dog.  Patient reports some increased swelling and redness with some oozing around the wounds today.  She has been ambulatory, but states that it hurts.  Denies any treatments prior to arrival.  Denies fevers or chills.  History provided by patient.   Review of Systems  Pertinent positive and negative review of systems noted in HPI.    Physical Exam   Vitals:   11/30/21 1959 12/01/21 0018  BP: 120/64 118/68  Pulse: 76 67  Resp: 18 15  Temp: 99.1 F (37.3 C) 98.7 F (37.1 C)  SpO2: 97% 99%    CONSTITUTIONAL:  well-appearing, NAD NEURO:  Alert and oriented x 3, CN 3-12 grossly intact EYES:  eyes equal and reactive ENT/NECK:  Supple, no stridor  CARDIO:  normal rate, regular rhythm, appears well-perfused  PULM:  No respiratory distress, CTAB GI/GU:  non-distended,  MSK/SPINE:  No gross deformities, no edema, moves all extremities  SKIN:  no rash, atraumatic     *Additional and/or pertinent findings included in MDM below  Diagnostic and Interventional Summary    EKG Interpretation  Date/Time:    Ventricular Rate:    PR Interval:    QRS Duration:   QT Interval:    QTC Calculation:   R Axis:     Text Interpretation:         Labs Reviewed - No data to display  No orders to display    Medications  amoxicillin-clavulanate (AUGMENTIN) 875-125 MG per tablet 1 tablet (1 tablet Oral Given 12/01/21 0007)  ibuprofen (ADVIL) tablet 600 mg (600 mg Oral Given 12/01/21 0008)     Procedures  /  Critical Care Procedures  ED Course and Medical Decision Making  I have reviewed the triage vital signs, the nursing  notes, and pertinent available records from the EMR.  Social Determinants Affecting Complexity of Care: Patient has no clinically significant social determinants affecting this chief complaint..   ED Course:    Medical Decision Making Patient here with dog bite that occurred 2 days ago.  I have encouraged her to call animal control so they can monitor the dog if they are not 100% certain about immunization status of the dog.  The dog is a house pet.  Likelihood of rabies is extremely low, but patient states that will contact animal control.  There does appear to be some local erythema surrounding the wounds, but does not appear to need any incision and drainage at this point.  Will treat with Augmentin.  Return precautions discussed.  Risk OTC drugs. Prescription drug management.     Consultants: No consultations were needed in caring for this patient.   Treatment and Plan: Emergency department workup does not suggest an emergent condition requiring admission or immediate intervention beyond  what has been performed at this time. The patient is safe for discharge and has  been instructed to return immediately for worsening symptoms, change in  symptoms or any other concerns    Final Clinical Impressions(s) / ED Diagnoses     ICD-10-CM   1. Dog bite, initial encounter  W54.0XXA  ED Discharge Orders          Ordered    amoxicillin-clavulanate (AUGMENTIN) 875-125 MG tablet  Every 12 hours        12/01/21 0013    ibuprofen (ADVIL) 600 MG tablet  Every 6 hours PRN        12/01/21 0013              Discharge Instructions Discussed with and Provided to Patient:   Discharge Instructions   None      Montine Circle, PA-C 12/01/21 0335    Quintella Reichert, MD 12/01/21 [phone]

## 2021-12-14 ENCOUNTER — Other Ambulatory Visit: Payer: Self-pay

## 2021-12-14 ENCOUNTER — Emergency Department (HOSPITAL_COMMUNITY)
Admission: EM | Admit: 2021-12-14 | Discharge: 2021-12-14 | Discharge status: Against Medical Advice | Payer: Medicaid Other | Attending: Emergency Medicine | Admitting: Emergency Medicine

## 2021-12-14 ENCOUNTER — Encounter (HOSPITAL_COMMUNITY): Payer: Self-pay

## 2021-12-14 DIAGNOSIS — F419 Anxiety disorder, unspecified: Secondary | ICD-10-CM | POA: Diagnosis present

## 2021-12-14 DIAGNOSIS — Z5321 Procedure and treatment not carried out due to patient leaving prior to being seen by health care provider: Secondary | ICD-10-CM | POA: Diagnosis not present
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
## 2022-01-11 IMAGING — US US MFM OB TRANSVAGINAL
1 series · 14 of 28 positions shown · non-contrast
Comparison: none

[Series 1: us mfm ob transvaginal · 92 acquisitions, 14 frames shown]
[im 4/92]
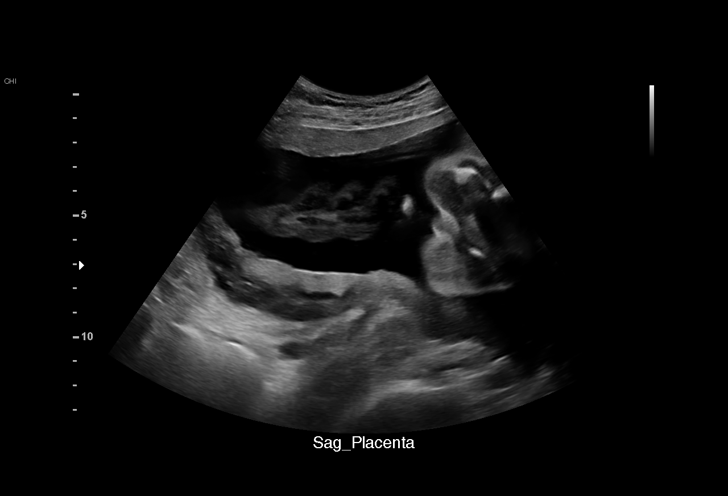
[im 11/92]
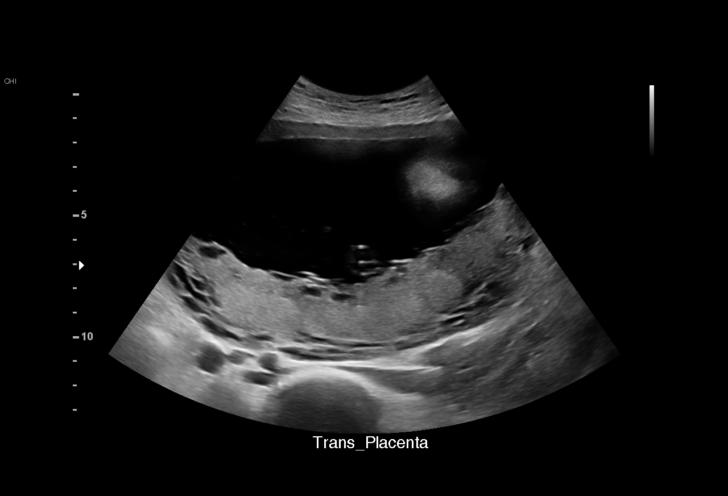
[im 17/92]
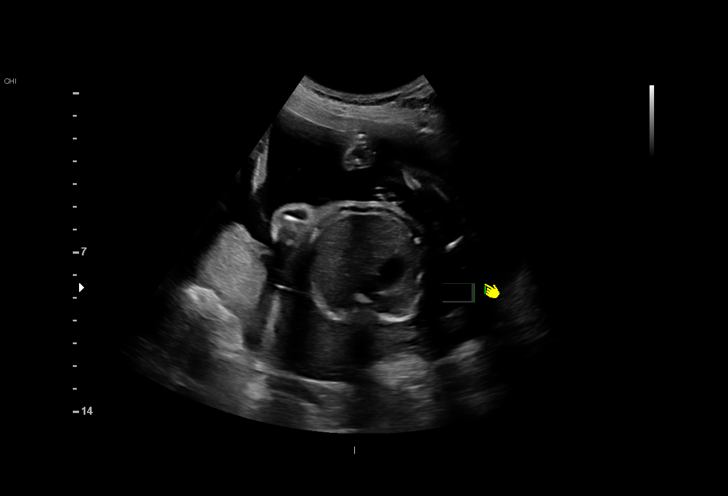
[im 24/92]
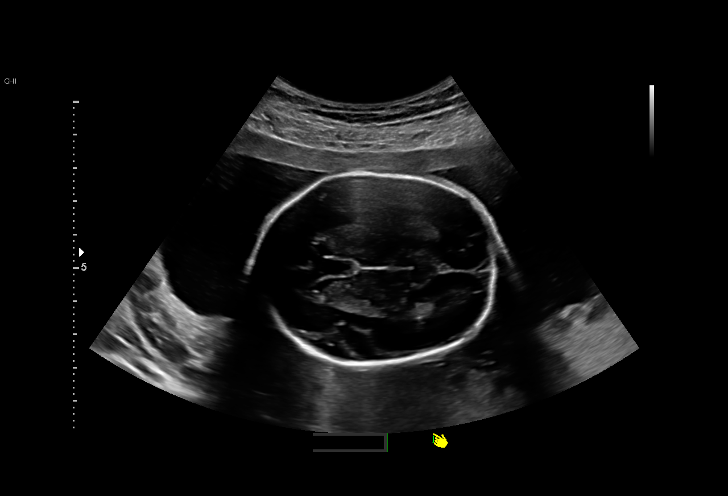
[im 31/92]
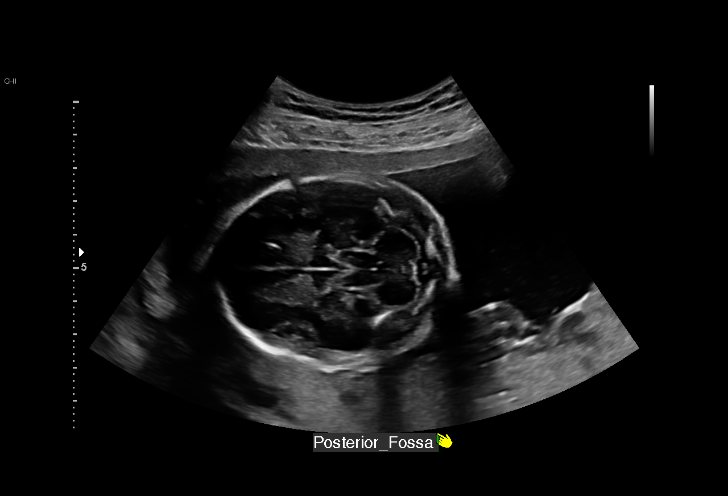
[im 38/92]
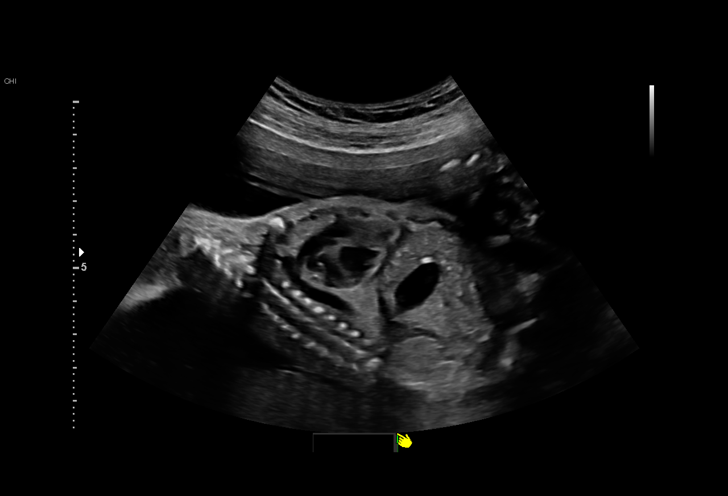
[im 44/92]
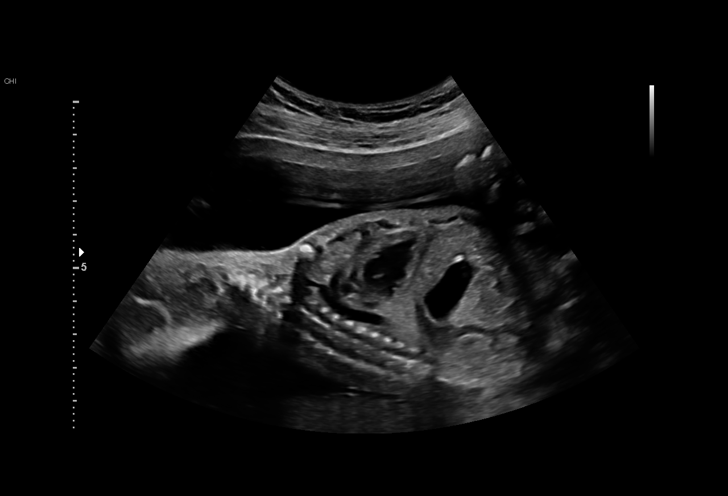
[im 51/92]
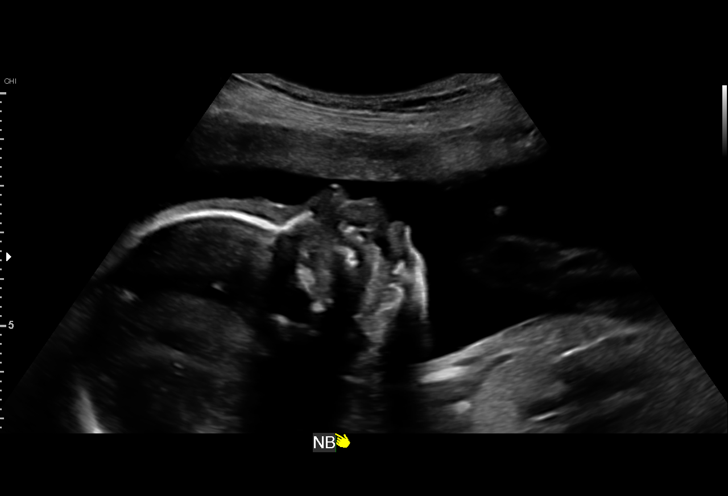
[im 58/92]
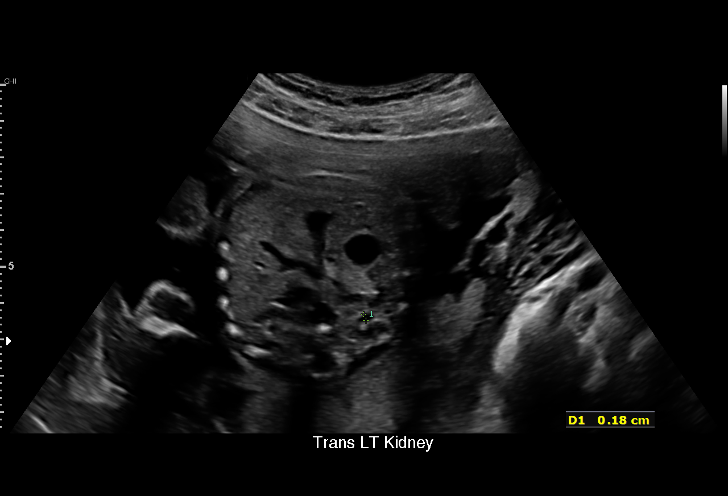
[im 65/92]
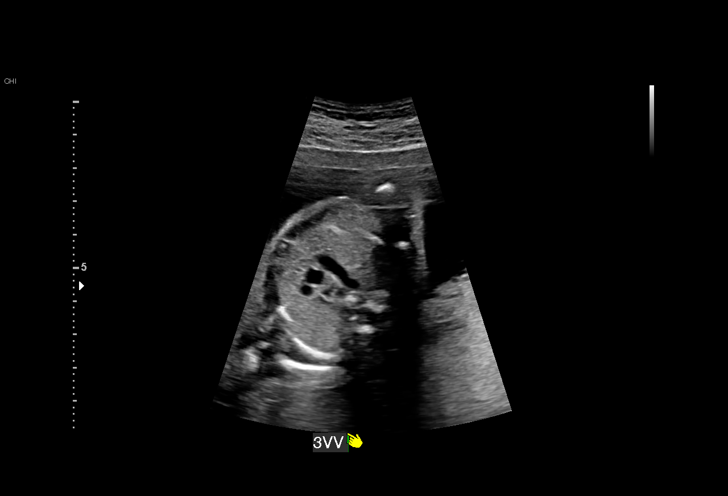
[im 71/92]
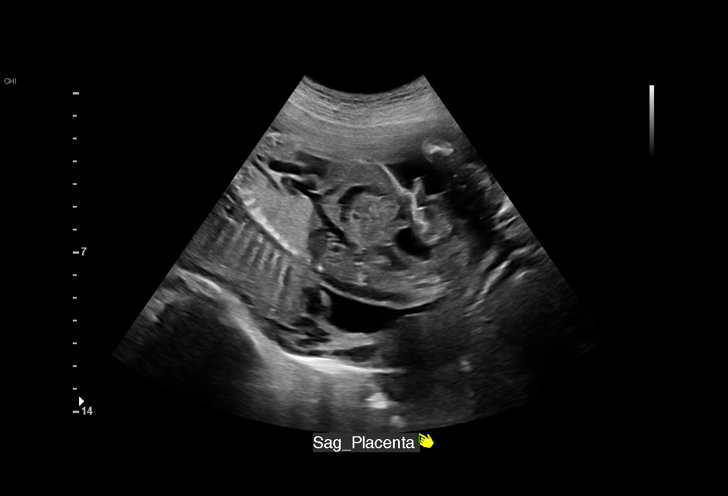
[im 78/92]
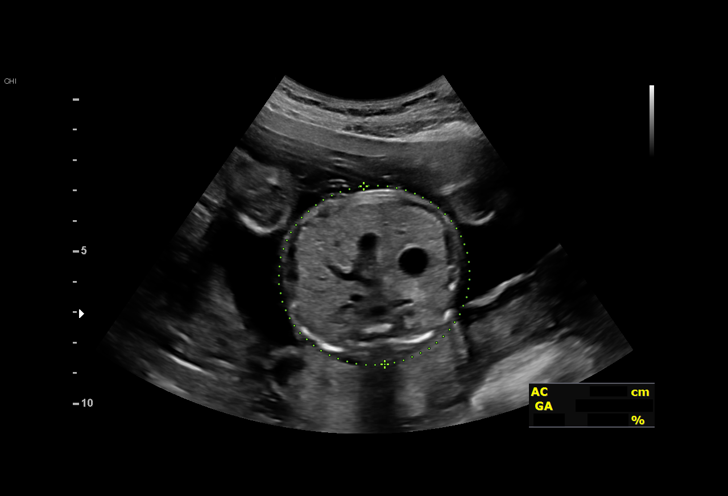
[im 85/92]
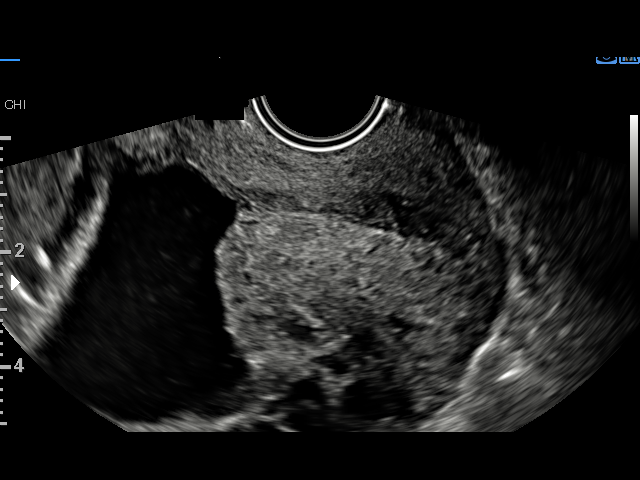
[im 92/92]
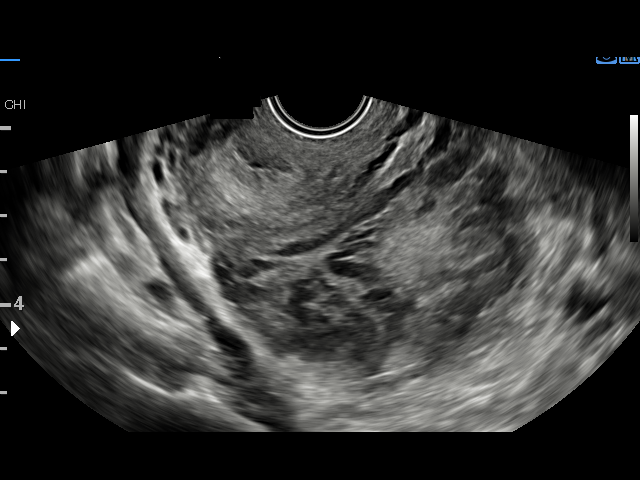

[14 of 28 positions shown; findings below may reference images not displayed]

OREALIZ

Indications

 Poor obstetric history: 17 week PPROM and
 loss
 Antenatal follow-up for nonvisualized fetal
 anatomy
 23 weeks gestation of pregnancy
 LR NIPS
Fetal Evaluation

 Num Of Fetuses:         1
 Fetal Heart Rate(bpm):  153
 Cardiac Activity:       Observed
 Presentation:           Breech
 Placenta:               Posterior
 P. Cord Insertion:      Previously Visualized

 Amniotic Fluid
 AFI FV:      Within normal limits

                             Largest Pocket(cm)

Biometry

 BPD:      56.6  mm     G. Age:  23w 2d         34  %    CI:        70.67   %    70 - 86
                                                         FL/HC:      19.1   %    18.7 -
 HC:      214.6  mm     G. Age:  23w 4d         31  %    HC/AC:      1.12        1.05 -
 AC:      191.3  mm     G. Age:  23w 6d         51  %    FL/BPD:     72.3   %    71 - 87
 FL:       40.9  mm     G. Age:  23w 2d         28  %    FL/AC:      21.4   %    20 - 24
 LV:        3.6  mm
 Est. FW:     608  gm      1 lb 5 oz     42  %
OB History

 Gravidity:    3         Term:   1         SAB:   1
Gestational Age

 LMP:           23w 4d        Date:  10/05/20                 EDD:   07/12/21
 Clinical EDD:  23w 4d                                        EDD:   07/12/21
 U/S Today:     23w 4d                                        EDD:   07/12/21
 Best:          23w 4d     Det. By:  Clinical EDD             EDD:   07/12/21
Anatomy

 Cranium:               Appears normal         Aortic Arch:            Appears normal
 Cavum:                 Appears normal         Ductal Arch:            Appears normal
 Ventricles:            Appears normal         Diaphragm:              Appears normal
 Choroid Plexus:        Previously seen        Stomach:                Appears normal, left
                                                                       sided
 Cerebellum:            Previously seen        Abdomen:                Appears normal
 Posterior Fossa:       Previously seen        Abdominal Wall:         Previously seen
 Nuchal Fold:           Previously seen        Cord Vessels:           Appears normal (3
                                                                       vessel cord)
 Face:                  Orbits and profile     Kidneys:                Appear normal
                        previously seen
 Lips:                  Previously seen        Bladder:                Appears normal
 Thoracic:              Appears normal         Spine:                  Previously seen
 Heart:                 Appears normal         Upper Extremities:      Previously seen
                        (4CH, axis, and
                        situs)
 RVOT:                  Appears normal         Lower Extremities:      Previously seen
 LVOT:                  Appears normal

 Other:  Previously fetus appears to be a male. VC, 3VV and 3VTV prev.
         visualized. Heels and 5th digit prev. visualized.
Cervix Uterus Adnexa

 Cervix
 Length:            3.9  cm.
 Normal appearance by transvaginal scan with fundal pressure
Impression

 Patient returned for completion of fetal anatomy .Amniotic
 fluid is normal and good fetal activity is seen .Fetal biometry
 is consistent with her previously-established dates .Fetal
 anatomical survey was completed and appears normal.

 Because of her 17-week pregnancy loss, we performed
 transvaginal ultrasound.  The cervix measures 3.8 cm, which
 is within normal range.  No shortening or funneling was seen
 on transfundal pressure.
Recommendations

 Follow-up scans as clinically indicated.
                 Ping, [place]

## 2022-03-31 ENCOUNTER — Ambulatory Visit
Admission: EM | Admit: 2022-03-31 | Discharge: 2022-03-31 | Disposition: A | Discharge status: Home / Self Care | Payer: Medicaid Other

## 2022-03-31 DIAGNOSIS — H00015 Hordeolum externum left lower eyelid: Secondary | ICD-10-CM

## 2022-03-31 MED ORDER — ERYTHROMYCIN 5 MG/GM OP OINT: TOPICAL_OINTMENT | OPHTHALMIC | 0 refills | Status: AC

## 2022-03-31 NOTE — ED Triage Notes (Signed)
Pt c/o left eye pain, and swelling.  Started: yesterday   Home interventions: none

## 2022-03-31 NOTE — Discharge Instructions (Addendum)
Antibiotic ointment as prescribed Warm compresses to the area as needed Follow-up with your PCP if symptoms do not improve Please go to the emergency room if you have any worsening symptoms

## 2022-03-31 NOTE — ED Provider Notes (Signed)
[facility]    CSN: [id number] Arrival date & time: 03/31/22  [place]      History   Chief Complaint Chief Complaint  Patient presents with   Eye Pain    HPI Christie Hart is a 27 y.o. female for evaluation of left lower eyelid swelling.  Patient states yesterday she noted some swelling to her mid lower left eyelid.  Denies drainage, warmth, fevers, chills, facial swelling.  Denies injury to the area.  She wears glasses only.  Denies any cough cold symptoms or drainage from the eye itself.  No other concerns at this time.   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 Authorizing Provider  erythromycin ophthalmic ointment Place a 1/2 inch ribbon of ointment into the left lower eyelid lash line 3 times a day for 7 days 03/31/22  Yes Melynda Ripple, NP  medroxyPROGESTERone Acetate (DEPO-PROVERA) 150  MG/ML SUSY Inject 150 mg by intramuscular route. 08/14/21  Yes [provider]  ibuprofen (ADVIL) 600 MG tablet Take 1 tablet (600 mg total) by mouth every 6 (six) hours as needed. 12/01/21   Montine Circle, PA-C    Family History Family History  Problem Relation Age of Onset   Diabetes Mother    Hypertension Mother     Social History Social History   Tobacco Use   Smoking status: Never   Smokeless tobacco: Never  Vaping Use   Vaping Use: Never used  Substance Use Topics   Alcohol use: No   Drug use: No     Allergies   Patient has no known allergies.   Review of Systems Review of Systems  Eyes:  Positive for pain.     Physical Exam Triage Vital Signs ED Triage Vitals  Enc Vitals Group     BP 03/31/22 1841 101/67     Pulse Rate 03/31/22 1841 95     Resp 03/31/22 1841 16     Temp 03/31/22 1841 98.7 F (37.1 C)     Temp Source 03/31/22 1841 Oral     SpO2 03/31/22 1841 97 %  Weight --      Height --      Head Circumference --      Peak Flow --      Pain Score 03/31/22 1840 8     Pain Loc --      Pain Edu? --      Excl. in [place]? --    No data found.  Updated Vital Signs BP 101/67 (BP Location: Right Arm)   Pulse 95   Temp 98.7 F (37.1 C) (Oral)   Resp 16   SpO2 97%   Breastfeeding No   Visual Acuity Right Eye Distance:   Left Eye Distance:   Bilateral Distance:    Right Eye Near:   Left Eye Near:    Bilateral Near:     Physical Exam Vitals and nursing note reviewed.  Constitutional:      Appearance: Normal appearance.  HENT:     Head: Normocephalic and atraumatic.  Eyes:     General:        Left eye: Hordeolum present.No foreign body or discharge.     Conjunctiva/sclera:     Left eye: Left conjunctiva is not injected. No chemosis, exudate or hemorrhage.    Pupils: Pupils are equal, round, and reactive to light.      Comments: Stye to mid left lower eye lid. Mild swelling without erythema or drainage  Cardiovascular:      Rate and Rhythm: Normal rate.  Pulmonary:     Effort: Pulmonary effort is normal.  Skin:    General: Skin is warm.  Neurological:     General: No focal deficit present.     Mental Status: She is alert and oriented to person, place, and time.  Psychiatric:        Mood and Affect: Mood normal.        Behavior: Behavior normal.      UC Treatments / Results  Labs (all labs ordered are listed, but only abnormal results are displayed) Labs Reviewed - No data to display  EKG   Radiology No results found.  Procedures Procedures (including critical care time)  Medications Ordered in UC Medications - No data to display  Initial Impression / Assessment and Plan / UC Course  I have reviewed the triage vital signs and the nursing notes.  Pertinent labs & imaging results that were available during my care of the patient were reviewed by me and considered in my medical decision making (see chart for details).     Reviewed exam and symptoms with patient.  No red flags on exam. Discussed stye Erythromycin ointment as prescribed Warm compresses to the area as needed Follow-up with PCP if symptoms do not improve ER precautions reviewed and patient verbalized understanding Final Clinical Impressions(s) / UC Diagnoses   Final diagnoses:  Hordeolum externum of left lower eyelid     Discharge Instructions      Antibiotic ointment as prescribed Warm compresses to the area as needed Follow-up with your PCP if symptoms do not improve Please go to the emergency room if you have any worsening symptoms   ED Prescriptions     Medication Sig Dispense Auth. Provider   erythromycin ophthalmic ointment Place a 1/2 inch ribbon of ointment into the left lower eyelid lash line 3 times a day for 7 days 3.5 g Melynda Ripple, NP      PDMP not reviewed this encounter.   Melynda Ripple, NP 03/31/22 [phone]

## 2022-04-03 ENCOUNTER — Emergency Department (HOSPITAL_COMMUNITY)
Admission: EM | Admit: 2022-04-03 | Discharge: 2022-04-03 | Disposition: A | Discharge status: Home / Self Care | Payer: Medicaid Other | Attending: Emergency Medicine | Admitting: Emergency Medicine

## 2022-04-03 ENCOUNTER — Emergency Department (HOSPITAL_COMMUNITY): Payer: Medicaid Other

## 2022-04-03 DIAGNOSIS — Z8616 Personal history of COVID-19: Secondary | ICD-10-CM | POA: Diagnosis not present

## 2022-04-03 DIAGNOSIS — R051 Acute cough: Secondary | ICD-10-CM | POA: Diagnosis not present

## 2022-04-03 DIAGNOSIS — R059 Cough, unspecified: Secondary | ICD-10-CM | POA: Diagnosis present

## 2022-04-03 MED ORDER — BENZONATATE 100 MG PO CAPS: 100.0000 mg | ORAL_CAPSULE | Freq: Three times a day (TID) | ORAL | 0 refills | Status: AC

## 2022-04-03 NOTE — Discharge Instructions (Addendum)
Thank you for allowing me to be part of your care today.  I have sent a prescription to your pharmacy to take a night to help with your cough.  You may continue to take Mucinex during the day for productive cough.    Please call [facility] to establish with primary care as this is likely an ongoing, chronic condition secondary to COVID.

## 2022-04-03 NOTE — ED Provider Notes (Signed)
[facility] Provider Note   CSN: [id number] Arrival date & time: 04/03/22  1935     History  Chief Complaint  Patient presents with   Cough    Christie Hart is a 27 y.o. female presents to the ED complaining of a cough.  Patient states that she had COVID back in October and has had a persistent cough since then.  She has not had this evaluated yet and does not have a PCP.  She states that during the day she feels like she needs to cough up phlegm but is unable to.  She has been taking Mucinex and other over-the-counter medicines for her cough without relief.  She does not have a history of asthma or other breathing problems.  Denies fever, shortness of breath, chest pain.       Home Medications Prior to Admission medications   Medication Sig Start Date End Date Taking? Authorizing Provider  benzonatate (TESSALON) 100 MG capsule Take 1 capsule (100 mg total) by mouth every 8 (eight) hours. 04/03/22  Yes Sinan Tuch R, PA  erythromycin ophthalmic ointment Place a 1/2 inch ribbon of ointment into the left lower eyelid lash line 3 times a day for 7 days 03/31/22   Melynda Ripple, NP  ibuprofen (ADVIL) 600 MG tablet Take 1 tablet (600 mg total) by mouth every 6 (six) hours as needed. 12/01/21   Montine Circle, PA-C  medroxyPROGESTERone Acetate (DEPO-PROVERA) 150 MG/ML SUSY Inject 150 mg by intramuscular route. 08/14/21   [provider]      Allergies    Patient has no known allergies.    Review of Systems   Review of Systems  Constitutional:  Negative for chills and fever.  Respiratory:  Positive for cough. Negative for chest tightness and shortness of breath.   Cardiovascular:  Negative for chest pain.    Physical Exam Updated Vital Signs BP 119/73 (BP Location: Right Arm)   Pulse 87   Temp 98.5 F (36.9 C) (Oral)   Resp 19   SpO2 100%  Physical Exam Vitals and nursing note reviewed.  Constitutional:       General: She is not in acute distress.    Appearance: Normal appearance. She is not ill-appearing or diaphoretic.  Cardiovascular:     Rate and Rhythm: Normal rate and regular rhythm.     Pulses: Normal pulses.     Heart sounds: Normal heart sounds.  Pulmonary:     Effort: Pulmonary effort is normal. No tachypnea or respiratory distress.     Breath sounds: Normal breath sounds and air entry.     Comments: Nonproductive cough appreciated following auscultation of lungs.  No appreciable wheezes or rhonchi. Neurological:     Mental Status: She is alert. Mental status is at baseline.  Psychiatric:        Mood and Affect: Mood normal.        Behavior: Behavior normal.     ED Results / Procedures / Treatments   Labs (all labs ordered are listed, but only abnormal results are displayed) Labs Reviewed - No data to display  EKG None  Radiology DG Chest 2 View  Result Date: 04/03/2022 CLINICAL DATA:  Persistent cough. EXAM: CHEST - 2 VIEW COMPARISON:  None Available. FINDINGS: The heart size and mediastinal contours are within normal limits. Both lungs are clear. The visualized skeletal structures are unremarkable. IMPRESSION: No active cardiopulmonary disease. Electronically Signed   By: Marijo Conception  M.D.   On: 04/03/2022 21:23    Procedures Procedures    Medications Ordered in ED Medications - No data to display  ED Course/ Medical Decision Making/ A&P                             Medical Decision Making Amount and/or Complexity of Data Reviewed Radiology: ordered.  Risk Prescription drug management.   Patient presents to the ED with concerns of a cough that has been persistent for the last 3 months.  Patient states that she had COVID back in October and has had this persistent cough since then.  She reports that it is sometimes productive and she has been taking Mucinex and Robitussin over-the-counter to try and help with her cough.  Patient does not have a PCP and has  not had her cough evaluated yet.  Patient does not have a history of asthma or other breathing problems.  On exam, patient appears comfortable and is not in respiratory distress.  She is able to speak in full sentences.  Lungs are clear to auscultation bilaterally with adequate lung volumes.  Heart rate is normal in the 80s with a regular rhythm.  Skin is warm and dry and nondiaphoretic.  She does have a nonproductive cough appreciated during patient interview.  I ordered and personally interpreted chest x-ray which shows no evidence of pneumonia, pneumothorax, or pleural effusion.  I agree with radiologist interpretation.  Discussed negative chest x-ray findings with patient and states that she could have a chronic ongoing cough from COVID-19 as this is sometimes result of long COVID.  Will prescribe patient Tessalon Perles to help with cough suppressant at night so she can get some rest.  Gave patient to health connect phone number so she can schedule with primary care as she may need chronic management of her cough.  The patient has been appropriately medically screened and/or stabilized in the ED. I have low suspicion for any other emergent medical condition which would require further screening, evaluation or treatment in the ED or require inpatient management. At time of discharge the patient is hemodynamically stable and in no acute distress. I have discussed work-up results and diagnosis with patient and answered all questions. Patient is agreeable with discharge plan. We discussed strict return precautions for returning to the emergency department and they verbalized understanding.           Final Clinical Impression(s) / ED Diagnoses Final diagnoses:  Acute cough    Rx / DC Orders ED Discharge Orders          Ordered    benzonatate (TESSALON) 100 MG capsule  Every 8 hours        04/03/22 2153              Pat Kocher, [place] 04/03/22 2256    Fredia Sorrow,  MD 04/03/22 2332

## 2022-04-03 NOTE — ED Provider Triage Note (Signed)
Emergency Medicine Provider Triage Evaluation Note  Christie Hart , a 27 y.o. female  was evaluated in triage.  Pt complains of persistent cough and mild chest tightness since she had COVID in October.  Has been told to have it checked out, but has not yet seen anyone for her cough.  No history of asthma or other breathing problems.  Denies fevers, shortness of breath, chest pain.    Review of Systems  Positive: As above Negative: As above  Physical Exam  There were no vitals taken for this visit. Gen:   Awake, no distress   Resp:  Normal effort, lungs clear to auscultation bilaterally MSK:   Moves extremities without difficulty  Other:    Medical Decision Making  Medically screening exam initiated at 8:31 PM.  Appropriate orders placed.  Christie Hart was informed that the remainder of the evaluation will be completed by another provider, this initial triage assessment does not replace that evaluation, and the importance of remaining in the ED until their evaluation is complete.     Pat Kocher, [place] 04/03/22 2032

## 2022-04-03 NOTE — ED Triage Notes (Signed)
Patient here from home reporting ongoing cough for approx 3 months after she had covid.

## 2022-05-20 ENCOUNTER — Ambulatory Visit
Admission: EM | Admit: 2022-05-20 | Discharge: 2022-05-20 | Disposition: A | Discharge status: Home / Self Care | Payer: Medicaid Other | Attending: Nurse Practitioner | Admitting: Nurse Practitioner

## 2022-05-20 ENCOUNTER — Telehealth: Payer: Self-pay

## 2022-05-20 DIAGNOSIS — R110 Nausea: Secondary | ICD-10-CM | POA: Diagnosis present

## 2022-05-20 DIAGNOSIS — A084 Viral intestinal infection, unspecified: Secondary | ICD-10-CM | POA: Diagnosis present

## 2022-05-20 DIAGNOSIS — R197 Diarrhea, unspecified: Secondary | ICD-10-CM | POA: Diagnosis present

## 2022-05-20 DIAGNOSIS — Z1152 Encounter for screening for COVID-19: Secondary | ICD-10-CM | POA: Diagnosis not present

## 2022-05-20 MED ORDER — ONDANSETRON 4 MG PO TBDP: 4.0000 mg | ORAL_TABLET | Freq: Three times a day (TID) | ORAL | 0 refills | Status: AC | PRN

## 2022-05-20 MED ORDER — ONDANSETRON 4 MG PO TBDP: 4.0000 mg | ORAL_TABLET | Freq: Three times a day (TID) | ORAL | 0 refills | Status: DC | PRN

## 2022-05-20 NOTE — ED Triage Notes (Signed)
Diarrhea over 3-4 x/day and upset stomach since Saturday night. Pepto and Tums. No N/V/F, dizziness or chills.

## 2022-05-20 NOTE — Discharge Instructions (Signed)
Zofran as needed for nausea or vomiting  Rest and fluids with Gatorade, poweraid, Pedialyte, water BRAT diet and advance as tolerated (bananas, rice, apple sauce, toast) Follow up with your PCP if symptoms do not improve Please go to the ER for any worsening symptoms  I hope you feel better soon!

## 2022-05-20 NOTE — ED Provider Notes (Signed)
[facility]    CSN: [id number] Arrival date & time: 05/20/22  0848      History   Chief Complaint Chief Complaint  Patient presents with   Diarrhea    HPI Christie Hart is a 27 y.o. female presents for evaluation of nausea and diarrhea.  Patient reports 3 to 4 days of nonbloody partially formed/liquid diarrhea with nausea.  Denies any vomiting, fevers, URI symptoms.  Does have abdominal pain/"gurgling "prior to onset of diarrhea only.  No history of GI diagnoses such as Crohn's or IBS no recent travel.  Her young daughter who attends daycare has similar symptoms.  She has been staying hydrated.  Decreased appetite.  She has been using Pepto for small symptoms with minimal improvement.  No other concerns at this time.   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 Authorizing Provider  ondansetron  (ZOFRAN-ODT) 4 MG disintegrating tablet Take 1 tablet (4 mg total) by mouth every 8 (eight) hours as needed for nausea or vomiting. 05/20/22  Yes Melynda Ripple, NP  benzonatate (TESSALON) 100 MG capsule Take 1 capsule (100 mg total) by mouth every 8 (eight) hours. 04/03/22   Theressa Stamps R, PA  erythromycin ophthalmic ointment Place a 1/2 inch ribbon of ointment into the left lower eyelid lash line 3 times a day for 7 days 03/31/22   Melynda Ripple, NP  ibuprofen (ADVIL) 600 MG tablet Take 1 tablet (600 mg total) by mouth every 6 (six) hours as needed. 12/01/21   Montine Circle, PA-C  medroxyPROGESTERone Acetate (DEPO-PROVERA) 150 MG/ML SUSY Inject 150 mg by intramuscular route. 08/14/21   [provider]    Family History Family History  Problem Relation Age of Onset   Diabetes Mother    Hypertension Mother     Social History Social History   Tobacco Use   Smoking status: Never   Smokeless tobacco: Never  Vaping Use   Vaping Use: Never used  Substance Use Topics   Alcohol use: No   Drug use: No     Allergies   Patient has no known allergies.  Review of Systems Review of Systems  Gastrointestinal:  Positive for diarrhea and nausea.     Physical Exam Triage Vital Signs ED Triage Vitals  Enc Vitals Group     BP 05/20/22 0857 114/72     Pulse Rate 05/20/22 0857 72     Resp 05/20/22 0857 18     Temp 05/20/22 0857 98.5 F (36.9 C)     Temp Source 05/20/22 0857 Oral     SpO2 05/20/22 0857 96 %     Weight --      Height --      Head Circumference --      Peak Flow --      Pain Score 05/20/22 0856 0     Pain Loc --      Pain Edu? --      Excl. in [place]? --    No data found.  Updated Vital Signs BP 114/72 (BP Location: Right Arm)   Pulse 72   Temp 98.5 F (36.9 C) (Oral)   Resp 18   LMP  (LMP Unknown)   SpO2 96%   Visual Acuity Right Eye Distance:   Left Eye Distance:   Bilateral Distance:    Right Eye Near:   Left Eye Near:    Bilateral Near:      Physical Exam Vitals and nursing note reviewed.  Constitutional:      Appearance: Normal appearance.  HENT:     Head: Normocephalic and atraumatic.  Eyes:     Pupils: Pupils are equal, round, and reactive to light.  Cardiovascular:     Rate and Rhythm: Normal rate.  Pulmonary:     Effort: Pulmonary effort is normal.  Abdominal:     General: Abdomen is flat.     Palpations: Abdomen is soft. There is no hepatomegaly or splenomegaly.     Tenderness: There is generalized abdominal tenderness. Negative signs include Rovsing's sign and McBurney's sign.  Skin:    General: Skin is warm and dry.  Neurological:     General: No focal deficit present.     Mental Status: She is alert and oriented to person, place, and time.  Psychiatric:        Mood and Affect: Mood normal.        Behavior: Behavior normal.      UC Treatments / Results  Labs (all labs ordered are listed, but only abnormal results are displayed) Labs Reviewed  SARS CORONAVIRUS 2 (TAT 6-24 HRS)    EKG   Radiology No results found.  Procedures Procedures (including critical care time)  Medications Ordered in UC Medications - No data to display  Initial Impression / Assessment and Plan / UC Course  I have reviewed the triage vital signs and the nursing notes.  Pertinent labs & imaging results that were available during my care of the patient were reviewed by me and considered in my medical decision making (see chart for details).     Reviewed exam and symptoms with patient.  No red flags Discussed viral enteritis and symptomatic treatment Zofran as needed nausea Brat diet advance as tolerated Encouraged fluids PCP follow-up if symptoms do not improve ER precautions reviewed and patient verbalized understanding Final Clinical Impressions(s) / UC Diagnoses   Final diagnoses:  Nausea without vomiting  Diarrhea, unspecified type  Viral gastroenteritis     Discharge Instructions      Zofran as  needed for nausea or vomiting  Rest and fluids with Gatorade, poweraid, Pedialyte, water BRAT  diet and advance as tolerated (bananas, rice, apple sauce, toast) Follow up with your PCP if symptoms do not improve Please go to the ER for any worsening symptoms  I hope you feel better soon!   ED Prescriptions     Medication Sig Dispense Auth. Provider   ondansetron (ZOFRAN-ODT) 4 MG disintegrating tablet Take 1 tablet (4 mg total) by mouth every 8 (eight) hours as needed for nausea or vomiting. 20 tablet Melynda Ripple, NP      PDMP not reviewed this encounter.   Melynda Ripple, NP 05/20/22 [id number]

## 2022-05-21 LAB — SARS CORONAVIRUS 2 (TAT 6-24 HRS): SARS Coronavirus 2: NEGATIVE

## 2022-10-26 ENCOUNTER — Emergency Department (HOSPITAL_COMMUNITY)
Admission: EM | Admit: 2022-10-26 | Discharge: 2022-10-26 | Disposition: A | Discharge status: Home / Self Care | Payer: Medicaid Other | Attending: Emergency Medicine | Admitting: Emergency Medicine

## 2022-10-26 ENCOUNTER — Emergency Department (HOSPITAL_COMMUNITY): Payer: Medicaid Other

## 2022-10-26 ENCOUNTER — Encounter (HOSPITAL_COMMUNITY): Payer: Self-pay

## 2022-10-26 ENCOUNTER — Other Ambulatory Visit: Payer: Self-pay

## 2022-10-26 DIAGNOSIS — N390 Urinary tract infection, site not specified: Secondary | ICD-10-CM | POA: Insufficient documentation

## 2022-10-26 DIAGNOSIS — R197 Diarrhea, unspecified: Secondary | ICD-10-CM

## 2022-10-26 DIAGNOSIS — R1032 Left lower quadrant pain: Secondary | ICD-10-CM | POA: Diagnosis present

## 2022-10-26 DIAGNOSIS — A084 Viral intestinal infection, unspecified: Secondary | ICD-10-CM | POA: Diagnosis not present

## 2022-10-26 LAB — CBC
HCT: 48.3 % — ABNORMAL HIGH (ref 36.0–46.0)
Hemoglobin: 15.9 g/dL — ABNORMAL HIGH (ref 12.0–15.0)
MCH: 28.2 pg (ref 26.0–34.0)
MCHC: 32.9 g/dL (ref 30.0–36.0)
MCV: 85.8 fL (ref 80.0–100.0)
Platelets: 207 10*3/uL (ref 150–400)
RBC: 5.63 MIL/uL — ABNORMAL HIGH (ref 3.87–5.11)
RDW: 13.2 % (ref 11.5–15.5)
WBC: 7.4 10*3/uL (ref 4.0–10.5)
nRBC: 0 % (ref 0.0–0.2)

## 2022-10-26 LAB — URINALYSIS, ROUTINE W REFLEX MICROSCOPIC
Bilirubin Urine: NEGATIVE
Glucose, UA: NEGATIVE mg/dL
Hgb urine dipstick: NEGATIVE
Ketones, ur: 5 mg/dL — AB
Nitrite: NEGATIVE
Protein, ur: 30 mg/dL — AB
Specific Gravity, Urine: 1.029 (ref 1.005–1.030)
WBC, UA: >50 WBC/hpf (ref 0–5)
pH: 5 (ref 5.0–8.0)

## 2022-10-26 LAB — COMPREHENSIVE METABOLIC PANEL
ALT: 22 U/L (ref 0–44)
AST: 27 U/L (ref 15–41)
Albumin: 3.8 g/dL (ref 3.5–5.0)
Alkaline Phosphatase: 61 U/L (ref 38–126)
Anion gap: 9 (ref 5–15)
BUN: 10 mg/dL (ref 6–20)
CO2: 20 mmol/L — ABNORMAL LOW (ref 22–32)
Calcium: 9.3 mg/dL (ref 8.9–10.3)
Chloride: 107 mmol/L (ref 98–111)
Creatinine, Ser: 1 mg/dL (ref 0.44–1.00)
GFR, Estimated: >60 mL/min (ref >60)
Glucose, Bld: 91 mg/dL (ref 70–99)
Potassium: 3.6 mmol/L (ref 3.5–5.1)
Sodium: 136 mmol/L (ref 135–145)
Total Bilirubin: 0.4 mg/dL (ref 0.3–1.2)
Total Protein: 7.7 g/dL (ref 6.5–8.1)

## 2022-10-26 LAB — LIPASE, BLOOD: Lipase: 25 U/L (ref 11–51)

## 2022-10-26 LAB — HCG, SERUM, QUALITATIVE: Preg, Serum: NEGATIVE

## 2022-10-26 MED ORDER — LACTATED RINGERS IV BOLUS
1000.0000 mL | Freq: Once | INTRAVENOUS | Status: AC
  Administered 2022-10-26: 1000 mL via INTRAVENOUS

## 2022-10-26 MED ORDER — SODIUM CHLORIDE (PF) 0.9 % IJ SOLN
INTRAMUSCULAR | Status: AC
  Filled 2022-10-26: qty 50

## 2022-10-26 MED ORDER — SODIUM CHLORIDE 0.9 % IV SOLN
1.0000 g | Freq: Once | INTRAVENOUS | Status: AC
  Administered 2022-10-26: 1 g via INTRAVENOUS
  Filled 2022-10-26: qty 10

## 2022-10-26 MED ORDER — CEPHALEXIN 500 MG PO CAPS: 500.0000 mg | ORAL_CAPSULE | Freq: Two times a day (BID) | ORAL | 0 refills | Status: AC

## 2022-10-26 MED ORDER — IOHEXOL 300 MG/ML  SOLN
100.0000 mL | Freq: Once | INTRAMUSCULAR | Status: AC | PRN
  Administered 2022-10-26: 100 mL via INTRAVENOUS

## 2022-10-26 MED ORDER — DICYCLOMINE HCL 10 MG/ML IM SOLN
20.0000 mg | Freq: Once | INTRAMUSCULAR | Status: AC
  Administered 2022-10-26: 20 mg via INTRAMUSCULAR
  Filled 2022-10-26: qty 2

## 2022-10-26 MED ORDER — LOPERAMIDE HCL 2 MG PO CAPS: 2.0000 mg | ORAL_CAPSULE | Freq: Four times a day (QID) | ORAL | 0 refills | Status: AC | PRN

## 2022-10-26 NOTE — ED Triage Notes (Addendum)
Patient has had diarrhea for 2 days and stated she feels "sick." Began after eating a hotdog. Denies abdominal pain or vomiting, cough, runny nose.

## 2022-10-26 NOTE — Discharge Instructions (Signed)
As discussed, today's evaluation has been generally reassuring.  With your ongoing GI illness that is important to stay well stay hydrated, and monitor your condition carefully.  Return here for concerning changes in your condition.

## 2022-10-26 NOTE — ED Provider Notes (Signed)
[place] [facility] AT [facility] Provider Note   CSN: [id number] Arrival date & time: 10/26/22  7829     History  Chief Complaint  Patient presents with   Diarrhea    Christie Hart is a 27 y.o. female.  HPI Presents with concern of left lower quadrant abdominal pain, diarrhea. Patient states that she is generally well, was so prior to 2 days ago.  Now since that time she has had persistent abdominal discomfort, and has had innumerable episodes of loose stool.  No vomiting, there is both anorexia, nausea. No fever, no chest pain or other concerns.  No ability to take medication for relief.  Medications Prior to Admission medications   Medication Sig Start Date End Date Taking? Authorizing Provider  cephALEXin (KEFLEX) 500 MG capsule Take 1 capsule (500 mg total) by mouth 2 (two) times daily for 5 days. 10/26/22 10/31/22 Yes Gerhard Munch, MD  loperamide (IMODIUM) 2 MG capsule Take 1 capsule (2 mg total) by mouth 4 (four) times daily as needed for diarrhea or loose stools. 10/26/22  Yes Gerhard Munch, MD  benzonatate (TESSALON) 100 MG capsule Take 1 capsule (100 mg total) by mouth every 8 (eight) hours. 04/03/22   Melton Alar R, PA-C  erythromycin ophthalmic ointment Place a 1/2 inch ribbon of ointment into the left lower eyelid lash line 3 times a day for 7 days 03/31/22   Radford Pax, NP  ibuprofen (ADVIL) 600 MG tablet Take 1 tablet (600 mg total) by mouth every 6 (six) hours as needed. 12/01/21   Roxy Horseman, PA-C  medroxyPROGESTERone Acetate (DEPO-PROVERA) 150 MG/ML SUSY Inject 150 mg by intramuscular route. 08/14/21   [provider]  ondansetron (ZOFRAN-ODT) 4 MG disintegrating tablet Take 1 tablet (4 mg total) by mouth every 8 (eight) hours as needed for nausea or vomiting. 05/20/22   Radford Pax, NP      Allergies    Patient has no known allergies.    Review of Systems   Review of Systems  All other systems reviewed and are  negative.   Physical Exam Updated Vital Signs BP 108/63   Pulse 67   Temp 99.3 F (37.4 C) (Oral)   Resp 18   Ht 5\' 8"  (1.727 m)   Wt 65.8 kg   SpO2 100%   BMI 22.05 kg/m  Physical Exam Vitals and nursing note reviewed.  Constitutional:      General: She is not in acute distress.    Appearance: She is well-developed.  HENT:     Head: Normocephalic and atraumatic.  Eyes:     Conjunctiva/sclera: Conjunctivae normal.  Cardiovascular:     Rate and Rhythm: Normal rate and regular rhythm.  Pulmonary:     Effort: Pulmonary effort is normal. No respiratory distress.     Breath sounds: Normal breath sounds. No stridor.  Abdominal:     General: There is no distension.     Tenderness: There is abdominal tenderness in the left lower quadrant.  Skin:    General: Skin is warm and dry.  Neurological:     Mental Status: She is alert and oriented to person, place, and time.     Cranial Nerves: No cranial nerve deficit.  Psychiatric:        Mood and Affect: Mood normal.     ED Results / Procedures / Treatments   Labs (all labs ordered are listed, but only abnormal results are displayed) Labs Reviewed  COMPREHENSIVE METABOLIC PANEL -  Abnormal; Notable for the following components:      Result Value   CO2 20 (*)    All other components within normal limits  CBC - Abnormal; Notable for the following components:   RBC 5.63 (*)    Hemoglobin 15.9 (*)    HCT 48.3 (*)    All other components within normal limits  URINALYSIS, ROUTINE W REFLEX MICROSCOPIC - Abnormal; Notable for the following components:   Color, Urine AMBER (*)    APPearance CLOUDY (*)    Ketones, ur 5 (*)    Protein, ur 30 (*)    Leukocytes,Ua MODERATE (*)    Bacteria, UA RARE (*)    All other components within normal limits  LIPASE, BLOOD  HCG, SERUM, QUALITATIVE    EKG None  Radiology CT ABDOMEN PELVIS W CONTRAST  Result Date: 10/26/2022 CLINICAL DATA:  Left lower quadrant pain and diarrhea for 2  days. EXAM: CT ABDOMEN AND PELVIS WITH CONTRAST TECHNIQUE: Multidetector CT imaging of the abdomen and pelvis was performed using the standard protocol following bolus administration of intravenous contrast. RADIATION DOSE REDUCTION: This exam was performed according to the departmental dose-optimization program which includes automated exposure control, adjustment of the mA and/or kV according to patient size and/or use of iterative reconstruction technique. CONTRAST:  OMNIPAQUE IOHEXOL 300 MG/ML  SOLN COMPARISON:  None Available. FINDINGS: Lower Chest: No acute findings. Hepatobiliary: No suspicious hepatic masses identified. 1 or 2 tiny gallstones are noted, however there are no signs of cholecystitis or biliary ductal dilatation. Pancreas:  No mass or inflammatory changes. Spleen: Mild splenomegaly, with length measuring 14-15 cm. Adrenals/Urinary Tract: No suspicious masses identified. No evidence of ureteral calculi or hydronephrosis. Stomach/Bowel: No evidence of obstruction, inflammatory process or abnormal fluid collections. Normal appendix visualized. Vascular/Lymphatic: No pathologically enlarged lymph nodes. No acute vascular findings. Reproductive:  No mass or other significant abnormality. Other:  None. Musculoskeletal:  No suspicious bone lesions identified. IMPRESSION: No acute findings within the abdomen or pelvis. Mild splenomegaly. Cholelithiasis. No radiographic evidence of cholecystitis. Electronically Signed   By: Danae Orleans M.D.   On: 10/26/2022 11:39    Procedures Procedures    Medications Ordered in ED Medications  lactated ringers bolus 1,000 mL (0 mLs Intravenous Stopped 10/26/22 1146)  dicyclomine (BENTYL) injection 20 mg (20 mg Intramuscular Given 10/26/22 0919)  iohexol (OMNIPAQUE) 300 MG/ML solution 100 mL (100 mLs Intravenous Contrast Given 10/26/22 0940)  cefTRIAXone (ROCEPHIN) 1 g in sodium chloride 0.9 % 100 mL IVPB (1 g Intravenous New Bag/Given 10/26/22 1145)     ED Course/ Medical Decision Making/ A&P                                 Medical Decision Making Thin adult female presents with left lower quadrant abdominal pain, diarrhea, on exam is tachycardic, uncomfortable appearing.  Differential including reproductive tract etiology including ruptured cyst versus colitis, diverticulitis considered. Point-of-care pregnancy negative.  Cardiac 115 sinus tach abnormal Pulse ox 99% room air normal   Amount and/or Complexity of Data Reviewed External Data Reviewed: notes.    Details: History of retained products of conception Labs:  Decision-making details documented in ED Course. Radiology: ordered and independent interpretation performed. Decision-making details documented in ED Course.  Risk Prescription drug management. Decision regarding hospitalization.   12:21 PM Patient awake, alert, in no distress, notes that she feels somewhat better.  CT labs reviewed, discussed, some evidence  for urinary tract infection, this might be secondary to dehydration with poor study.  However, given the patient's symptoms, evidence for dehydration, diarrhea, patient will continue antibiotics, continue fluids at home, is appropriate for discharge otherwise.        Final Clinical Impression(s) / ED Diagnoses Final diagnoses:  Diarrhea of presumed infectious origin  Lower urinary tract infectious disease    Rx / DC Orders ED Discharge Orders          Ordered    cephALEXin (KEFLEX) 500 MG capsule  2 times daily        10/26/22 1221    loperamide (IMODIUM) 2 MG capsule  4 times daily PRN        10/26/22 1221              Gerhard Munch, MD 10/26/22 1221

## 2023-01-25 ENCOUNTER — Ambulatory Visit (INDEPENDENT_AMBULATORY_CARE_PROVIDER_SITE_OTHER): Payer: Medicaid Other | Admitting: Licensed Clinical Social Worker

## 2023-01-25 DIAGNOSIS — F332 Major depressive disorder, recurrent severe without psychotic features: Secondary | ICD-10-CM | POA: Diagnosis not present

## 2023-01-25 DIAGNOSIS — F411 Generalized anxiety disorder: Secondary | ICD-10-CM
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
## 2023-01-26 ENCOUNTER — Encounter (HOSPITAL_COMMUNITY): Payer: Self-pay | Admitting: Physician Assistant

## 2023-01-26 ENCOUNTER — Ambulatory Visit (HOSPITAL_COMMUNITY): Payer: Medicaid Other | Admitting: Physician Assistant

## 2023-01-26 VITALS — BP 126/65 | HR 68 | Temp 98.8°F | Ht 69.0 in | Wt 143.2 lb

## 2023-01-26 DIAGNOSIS — Z8659 Personal history of other mental and behavioral disorders: Secondary | ICD-10-CM | POA: Insufficient documentation

## 2023-01-26 DIAGNOSIS — F332 Major depressive disorder, recurrent severe without psychotic features: Secondary | ICD-10-CM

## 2023-01-26 DIAGNOSIS — F411 Generalized anxiety disorder: Secondary | ICD-10-CM

## 2023-01-26 MED ORDER — ESCITALOPRAM OXALATE 10 MG PO TABS: 10.0000 mg | ORAL_TABLET | Freq: Every day | ORAL | 1 refills | Status: DC

## 2023-01-26 MED ORDER — ATOMOXETINE HCL 40 MG PO CAPS: 40.0000 mg | ORAL_CAPSULE | Freq: Every day | ORAL | 1 refills | Status: DC
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
## 2023-03-11 ENCOUNTER — Encounter (HOSPITAL_COMMUNITY): Payer: Medicaid Other | Admitting: Physician Assistant

## 2023-03-12 ENCOUNTER — Encounter (HOSPITAL_COMMUNITY): Payer: Self-pay | Admitting: Physician Assistant

## 2023-03-12 ENCOUNTER — Ambulatory Visit (INDEPENDENT_AMBULATORY_CARE_PROVIDER_SITE_OTHER): Payer: Medicaid Other | Admitting: Physician Assistant

## 2023-03-12 DIAGNOSIS — F332 Major depressive disorder, recurrent severe without psychotic features: Secondary | ICD-10-CM | POA: Diagnosis not present

## 2023-03-12 DIAGNOSIS — Z8659 Personal history of other mental and behavioral disorders: Secondary | ICD-10-CM

## 2023-03-12 DIAGNOSIS — F411 Generalized anxiety disorder: Secondary | ICD-10-CM

## 2023-03-12 MED ORDER — ESCITALOPRAM OXALATE 10 MG PO TABS: 10.0000 mg | ORAL_TABLET | Freq: Every day | ORAL | 1 refills | Status: DC

## 2023-03-12 MED ORDER — BUSPIRONE HCL 7.5 MG PO TABS: 7.5000 mg | ORAL_TABLET | Freq: Two times a day (BID) | ORAL | 1 refills | Status: DC

## 2023-03-12 MED ORDER — ATOMOXETINE HCL 40 MG PO CAPS: 40.0000 mg | ORAL_CAPSULE | Freq: Every day | ORAL | 1 refills | Status: DC
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
## 2023-03-16 ENCOUNTER — Telehealth (HOSPITAL_COMMUNITY): Payer: Self-pay | Admitting: Physician Assistant

## 2023-04-16 ENCOUNTER — Telehealth (HOSPITAL_COMMUNITY): Payer: Self-pay | Admitting: *Deleted

## 2023-04-16 ENCOUNTER — Encounter (HOSPITAL_COMMUNITY): Payer: Self-pay

## 2023-04-16 NOTE — Telephone Encounter (Signed)
 Patient called crying asking that her MD call her to discuss her medications. States she was fired at work because her UDS was positive for methamphetamines and is wondering if anything could have affected her UDS that she is currently prescribed.

## 2023-04-19 ENCOUNTER — Ambulatory Visit (HOSPITAL_COMMUNITY): Payer: Medicaid Other | Admitting: Clinical

## 2023-04-22 ENCOUNTER — Ambulatory Visit
Admission: EM | Admit: 2023-04-22 | Discharge: 2023-04-22 | Disposition: A | Discharge status: Home / Self Care | Payer: Medicaid Other | Attending: Family Medicine | Admitting: Family Medicine

## 2023-04-22 ENCOUNTER — Emergency Department (HOSPITAL_COMMUNITY): Payer: Medicaid Other

## 2023-04-22 ENCOUNTER — Emergency Department (HOSPITAL_COMMUNITY)
Admission: EM | Admit: 2023-04-22 | Discharge: 2023-04-22 | Disposition: A | Discharge status: Home / Self Care | Payer: Medicaid Other | Attending: Emergency Medicine | Admitting: Emergency Medicine

## 2023-04-22 ENCOUNTER — Encounter (HOSPITAL_COMMUNITY): Payer: Self-pay | Admitting: *Deleted

## 2023-04-22 ENCOUNTER — Other Ambulatory Visit: Payer: Self-pay

## 2023-04-22 DIAGNOSIS — R509 Fever, unspecified: Secondary | ICD-10-CM | POA: Diagnosis not present

## 2023-04-22 DIAGNOSIS — L089 Local infection of the skin and subcutaneous tissue, unspecified: Secondary | ICD-10-CM | POA: Diagnosis not present

## 2023-04-22 DIAGNOSIS — J340 Abscess, furuncle and carbuncle of nose: Secondary | ICD-10-CM | POA: Insufficient documentation

## 2023-04-22 DIAGNOSIS — J019 Acute sinusitis, unspecified: Secondary | ICD-10-CM

## 2023-04-22 DIAGNOSIS — R0981 Nasal congestion: Secondary | ICD-10-CM | POA: Insufficient documentation

## 2023-04-22 DIAGNOSIS — R059 Cough, unspecified: Secondary | ICD-10-CM | POA: Insufficient documentation

## 2023-04-22 LAB — BASIC METABOLIC PANEL
Anion gap: 9 (ref 5–15)
BUN: 9 mg/dL (ref 6–20)
CO2: 25 mmol/L (ref 22–32)
Calcium: 9.5 mg/dL (ref 8.9–10.3)
Chloride: 103 mmol/L (ref 98–111)
Creatinine, Ser: 0.62 mg/dL (ref 0.44–1.00)
GFR, Estimated: >60 mL/min (ref >60)
Glucose, Bld: 99 mg/dL (ref 70–99)
Potassium: 4.1 mmol/L (ref 3.5–5.1)
Sodium: 137 mmol/L (ref 135–145)

## 2023-04-22 LAB — CBC
HCT: 42.9 % (ref 36.0–46.0)
Hemoglobin: 14.3 g/dL (ref 12.0–15.0)
MCH: 29.5 pg (ref 26.0–34.0)
MCHC: 33.3 g/dL (ref 30.0–36.0)
MCV: 88.5 fL (ref 80.0–100.0)
Platelets: 265 10*3/uL (ref 150–400)
RBC: 4.85 MIL/uL (ref 3.87–5.11)
RDW: 12.4 % (ref 11.5–15.5)
WBC: 7.8 10*3/uL (ref 4.0–10.5)
nRBC: 0 % (ref 0.0–0.2)

## 2023-04-22 LAB — HCG, QUANTITATIVE, PREGNANCY: hCG, Beta Chain, Quant, S: <1 m[IU]/mL (ref <5)

## 2023-04-22 MED ORDER — HYDROCODONE-ACETAMINOPHEN 5-325 MG PO TABS
1.0000 | ORAL_TABLET | Freq: Once | ORAL | Status: AC
  Administered 2023-04-22: 1 via ORAL
  Filled 2023-04-22: qty 1

## 2023-04-22 MED ORDER — DOXYCYCLINE HYCLATE 100 MG PO CAPS: 100.0000 mg | ORAL_CAPSULE | Freq: Two times a day (BID) | ORAL | 0 refills | Status: DC

## 2023-04-22 MED ORDER — AMOXICILLIN-POT CLAVULANATE 875-125 MG PO TABS: 1.0000 | ORAL_TABLET | Freq: Two times a day (BID) | ORAL | 0 refills | Status: AC

## 2023-04-22 MED ORDER — IOHEXOL 300 MG/ML  SOLN
75.0000 mL | Freq: Once | INTRAMUSCULAR | Status: AC | PRN
  Administered 2023-04-22: 75 mL via INTRAVENOUS

## 2023-04-22 NOTE — Discharge Instructions (Addendum)
You were seen in the emergency department today for an abscess in your nose.  Your CT scan looks like the abscess is only in the skin.  Your blood work was reassuring.  We will treat this with antibiotics.  I recommend trying some warm compresses over the area to encourage it to open and drain on its own.    If it worsens after 3 to 4 days of antibiotics, or is no better after you have finished the course, I recommend following up with the ear/nose/throat specialist.  I have attached their contact information.  They could drain this in the office.

## 2023-04-22 NOTE — ED Triage Notes (Signed)
Pt presents with c/o nose swelling, states she thinks she has a staph infection and c/o puss coming out of her nose.   Pt states it has been going on for 1 wk. Pt reports swelling above the lip and around the nose.

## 2023-04-22 NOTE — Discharge Instructions (Signed)
Please go to the ER for further evaluation and treatment of your symptoms

## 2023-04-22 NOTE — ED Notes (Signed)
Patient is being discharged from the Urgent Care and sent to the Emergency Department via POV. Per Cheri Rous, NP, patient is in need of higher level of care due to facial swelling. Patient is aware and verbalizes understanding of plan of care.  Vitals:   04/22/23 0813  BP: 121/78  Pulse: 98  Resp: 16  Temp: 100.1 F (37.8 C)  SpO2: 98%

## 2023-04-22 NOTE — ED Provider Notes (Signed)
[place] EMERGENCY DEPARTMENT AT [facility] Provider Note   CSN: [id number] Arrival date & time: 04/22/23  9562     History  Chief Complaint  Patient presents with   Abscess    Christie Hart is a 28 y.o. female with history of ADHD, depression, bipolar affective disorder, who presents the emergency department complaining of pain and swelling in her right nostril.  Patient initially had some redness and pain in that area over the past week.  She had the flu last week with a cough and nasal congestion.  She states that her flu symptoms are better, but the area on her right nostril is getting worse.  She went to urgent care and was instructed to come to the ER.  States she took 1 tablet of a "cef antibiotic" yesterday from a previous prescription.   Abscess Associated symptoms: fever        Home Medications Prior to Admission medications   Medication Sig Start Date End Date Taking? Authorizing Provider  doxycycline (VIBRAMYCIN) 100 MG capsule Take 1 capsule (100 mg total) by mouth 2 (two) times daily. 04/22/23  Yes Aadin Gaut T, PA-C  atomoxetine (STRATTERA) 40 MG capsule Take 1 capsule (40 mg total) by mouth daily. 03/12/23   Nwoko, Tommas Olp, PA  benzonatate (TESSALON) 100 MG capsule Take 1 capsule (100 mg total) by mouth every 8 (eight) hours. 04/03/22   Clark, Meghan R, PA-C  busPIRone (BUSPAR) 7.5 MG tablet Take 1 tablet (7.5 mg total) by mouth 2 (two) times daily. 03/12/23   Meta Hatchet, PA  erythromycin ophthalmic ointment Place a 1/2 inch ribbon of ointment into the left lower eyelid lash line 3 times a day for 7 days 03/31/22   Radford Pax, NP  escitalopram (LEXAPRO) 10 MG tablet Take 1 tablet (10 mg total) by mouth daily. 03/12/23   Nwoko, Tommas Olp, PA  ibuprofen (ADVIL) 600 MG tablet Take 1 tablet (600 mg total) by mouth every 6 (six) hours as needed. 12/01/21   Roxy Horseman, PA-C  loperamide (IMODIUM) 2 MG capsule Take 1 capsule (2 mg total) by  mouth 4 (four) times daily as needed for diarrhea or loose stools. 10/26/22   Gerhard Munch, MD  medroxyPROGESTERone Acetate (DEPO-PROVERA) 150 MG/ML SUSY Inject 150 mg by intramuscular route. 08/14/21   [provider]  ondansetron (ZOFRAN-ODT) 4 MG disintegrating tablet Take 1 tablet (4 mg total) by mouth every 8 (eight) hours as needed for nausea or vomiting. 05/20/22   Radford Pax, NP      Allergies    Patient has no known allergies.    Review of Systems   Review of Systems  Constitutional:  Positive for fever.  HENT:  Positive for sinus pressure.        Nare abscess  Eyes:  Negative for visual disturbance.  All other systems reviewed and are negative.   Physical Exam Updated Vital Signs BP 125/72   Pulse 81   Temp 98.8 F (37.1 C) (Oral)   Resp 16   Wt 65.3 kg   SpO2 100%   BMI 21.27 kg/m  Physical Exam Vitals and nursing note reviewed.  Constitutional:      Appearance: Normal appearance.  HENT:     Head: Normocephalic and atraumatic.     Nose: Nasal tenderness present.     Right Sinus: Maxillary sinus tenderness present.     Comments: Area of erythema, induration just inside the right nare. Swelling extends to the  right cheek with right maxillary sinus tenderness. Flattening of nasolabial fold on the right Eyes:     Conjunctiva/sclera: Conjunctivae normal.  Pulmonary:     Effort: Pulmonary effort is normal. No respiratory distress.  Skin:    General: Skin is warm and dry.  Neurological:     Mental Status: She is alert.  Psychiatric:        Mood and Affect: Mood normal.        Behavior: Behavior normal.     ED Results / Procedures / Treatments   Labs (all labs ordered are listed, but only abnormal results are displayed) Labs Reviewed  CBC  BASIC METABOLIC PANEL  HCG, QUANTITATIVE, PREGNANCY    EKG None  Radiology No results found.  Procedures Procedures    Medications Ordered in ED Medications  HYDROcodone-acetaminophen  (NORCO/VICODIN) 5-325 MG per tablet 1 tablet (1 tablet Oral Given 04/22/23 1151)  iohexol (OMNIPAQUE) 300 MG/ML solution 75 mL (75 mLs Intravenous Contrast Given 04/22/23 1438)    ED Course/ Medical Decision Making/ A&P                                 Medical Decision Making Amount and/or Complexity of Data Reviewed Labs: ordered. Radiology: ordered.  Risk Prescription drug management.  This patient is a 28 y.o. female  who presents to the ED for concern of nasal abscess.   Differential diagnoses prior to evaluation: The emergent differential diagnosis includes, but is not limited to,  skin abscess, sinus abscess, intracranial abscess, osteomyelitis of sinus bones. This is not an exhaustive differential.   Past Medical History / Co-morbidities / Social History: ADHD, depression, bipolar affective disorder,  Additional history: Chart reviewed. Pertinent results include: Reviewed UC note  Physical Exam: Physical exam performed. The pertinent findings include: Mildly tachycardic at 102  Lab Tests/Imaging studies: I personally interpreted labs/imaging and the pertinent results include: CBC and BMP normal.  Negative pregnancy.  CT maxillofacial pending at time of shift change. Per my review, looks like abscess contained to the skin.   Medications: I ordered medication including Norco.  I have reviewed the patients home medicines and have made adjustments as needed.   Disposition: Patient discussed and care transferred to [facility] at shift change. Please see his/her note for further details regarding further ED course and disposition. Plan at time of handoff is follow up on CT imaging. Anticipate dc to home with antibiotics, follow up with ENT if abscess persistent or worsening. Otherwise patient clinically well appearing, not requiring admission for IV antibiotics. Antibiotic prescription sent to pharmacy.   Final Clinical Impression(s) / ED Diagnoses Final diagnoses:  Nasal  abscess    Rx / DC Orders ED Discharge Orders          Ordered    doxycycline (VIBRAMYCIN) 100 MG capsule  2 times daily        04/22/23 1459           Portions of this report may have been transcribed using voice recognition software. Every effort was made to ensure accuracy; however, inadvertent computerized transcription errors may be present.    Su Monks, PA-C 04/22/23 1509    Melene Plan, DO 04/22/23 1513

## 2023-04-22 NOTE — ED Triage Notes (Signed)
Here by POV from home for R nare abscess, including: redness, pain, pus, heat, swelling, TTP. Onset last week. Pain moving into R maxillary sinus area. Endorses recent fever. Reports was flu+ last week with cough and congestion. "Feels like flu sx are better, but R nare abscess is worse". Seen at [facility] PTA, instructed to come to ED. Rates pain 10/10. Took 1 "cef antibiotic" yesterday from previous Rx.

## 2023-04-22 NOTE — ED Provider Notes (Signed)
[facility]    CSN: [id number] Arrival date & time: 04/22/23  0801      History   Chief Complaint Chief Complaint  Patient presents with   Facial Swelling    HPI Christie Hart is a 28 y.o. female presents for a facial infection.  Patient reports about a week of right nostril swelling with purulent drainage as well as swelling that is extending into her upper lip and into her cheek.  Denies any injury to the area.  Does states she had the flu a couple weeks ago but those symptoms have nearly resolved with the exception of a runny nose.  She denies any fevers or chills.  No history of MRSA.  She has been using cool and warm compresses to the area without improvement.  No other concerns at this time.  HPI  Past Medical History:  Diagnosis Date   ADHD (attention deficit hyperactivity disorder)    Bipolar affective disorder (HCC)    Depression    Hx of chlamydia infection    Sleep difficulties     Patient Active Problem List   Diagnosis Date Noted   History of ADHD 01/26/2023   GAD (generalized anxiety disorder) 01/26/2023   Uterine contractions 07/11/2021   Retained products of conception after miscarriage 05/17/2020   Postoperative state 05/16/2020   Postpartum state 04/22/2020   Indication for care in labor or delivery 05/14/2019   Pregnancy 05/14/2019   Major depressive disorder, recurrent severe without psychotic features (HCC) 11/14/2017    Past Surgical History:  Procedure Laterality Date   DILATION AND EVACUATION N/A 05/16/2020   Procedure: DILATATION AND EVACUATION;  Surgeon: Carrington Clamp, MD;  Location: [facility] OR;  Service: Gynecology;  Laterality: N/A;   NO PAST SURGERIES      OB History     Gravida  3   Para  2   Term  2   Preterm      AB  1   Living  2      SAB  1   IAB      Ectopic      Multiple  0   Live Births  2            Home Medications    Prior to Admission medications   Medication Sig Start Date End  Date Taking? Authorizing Provider  atomoxetine (STRATTERA) 40 MG capsule Take 1 capsule (40 mg total) by mouth daily. 03/12/23   Nwoko, Tommas Olp, PA  benzonatate (TESSALON) 100 MG capsule Take 1 capsule (100 mg total) by mouth every 8 (eight) hours. 04/03/22   Clark, Meghan R, PA-C  busPIRone (BUSPAR) 7.5 MG tablet Take 1 tablet (7.5 mg total) by mouth 2 (two) times daily. 03/12/23   Meta Hatchet, PA  erythromycin ophthalmic ointment Place a 1/2 inch ribbon of ointment into the left lower eyelid lash line 3 times a day for 7 days 03/31/22   Radford Pax, NP  escitalopram (LEXAPRO) 10 MG tablet Take 1 tablet (10 mg total) by mouth daily. 03/12/23   Nwoko, Tommas Olp, PA  ibuprofen (ADVIL) 600 MG tablet Take 1 tablet (600 mg total) by mouth every 6 (six) hours as needed. 12/01/21   Roxy Horseman, PA-C  loperamide (IMODIUM) 2 MG capsule Take 1 capsule (2 mg total) by mouth 4 (four) times daily as needed for diarrhea or loose stools. 10/26/22   Gerhard Munch, MD  medroxyPROGESTERone Acetate (DEPO-PROVERA) 150 MG/ML SUSY Inject 150 mg by intramuscular  route. 08/14/21   [provider]  ondansetron (ZOFRAN-ODT) 4 MG disintegrating tablet Take 1 tablet (4 mg total) by mouth every 8 (eight) hours as needed for nausea or vomiting. 05/20/22   Radford Pax, NP    Family History Family History  Problem Relation Age of Onset   Diabetes Mother    Hypertension Mother     Social History Social History   Tobacco Use   Smoking status: Never   Smokeless tobacco: Never  Vaping Use   Vaping status: Never Used  Substance Use Topics   Alcohol use: No   Drug use: No     Allergies   Patient has no known allergies.   Review of Systems Review of Systems  HENT:         Facial swelling/nose infection      Physical Exam Triage Vital Signs ED Triage Vitals  Encounter Vitals Group     BP 04/22/23 0813 121/78     Systolic BP Percentile --      Diastolic BP Percentile --      Pulse Rate  04/22/23 0813 98     Resp 04/22/23 0813 16     Temp 04/22/23 0813 100.1 F (37.8 C)     Temp Source 04/22/23 0813 Oral     SpO2 04/22/23 0813 98 %     Weight --      Height --      Head Circumference --      Peak Flow --      Pain Score 04/22/23 0811 8     Pain Loc --      Pain Education --      Exclude from Growth Chart --    No data found.  Updated Vital Signs BP 121/78 (BP Location: Right Arm)   Pulse 98   Temp 100.1 F (37.8 C) (Oral)   Resp 16   SpO2 98%   Visual Acuity Right Eye Distance:   Left Eye Distance:   Bilateral Distance:    Right Eye Near:   Left Eye Near:    Bilateral Near:     Physical Exam Vitals and nursing note reviewed.  Constitutional:      General: She is not in acute distress.    Appearance: Normal appearance. She is not ill-appearing.  HENT:     Head: Normocephalic and atraumatic.     Jaw: No trismus.     Nose:      Comments: There is induration without fluctuance that extends from the right nostril into the upper lip/cheek.  There is a visible pustule to the right naris with erythema swelling.  Area is very tender to palpation.  No active drainage.  Eyes:     Pupils: Pupils are equal, round, and reactive to light.  Cardiovascular:     Rate and Rhythm: Normal rate.  Pulmonary:     Effort: Pulmonary effort is normal.  Skin:    General: Skin is warm and dry.  Neurological:     General: No focal deficit present.     Mental Status: She is alert and oriented to person, place, and time.  Psychiatric:        Mood and Affect: Mood normal.        Behavior: Behavior normal.      UC Treatments / Results  Labs (all labs ordered are listed, but only abnormal results are displayed) Labs Reviewed - No data to display  EKG   Radiology No results found.  Procedures Procedures (including critical care time)  Medications Ordered in UC Medications - No data to display  Initial Impression / Assessment and Plan / UC Course  I have  reviewed the triage vital signs and the nursing notes.  Pertinent labs & imaging results that were available during my care of the patient were reviewed by me and considered in my medical decision making (see chart for details).     Reviewed exam and symptoms with patient.  Patient presenting with facial infection and is febrile in clinic.  Given her presentation and her fever I advised that she go to the emergency room for further evaluation and treatment as well as possible IV antibiotics.  She is in agreement with plan will go POV to the emergency room. Final Clinical Impressions(s) / UC Diagnoses   Final diagnoses:  Facial infection  Fever, unspecified     Discharge Instructions      Please go to the ER for further evaluation and treatment of your symptoms    ED Prescriptions   None    PDMP not reviewed this encounter.   Radford Pax, NP 04/22/23 [phone]

## 2023-04-23 ENCOUNTER — Ambulatory Visit (INDEPENDENT_AMBULATORY_CARE_PROVIDER_SITE_OTHER): Payer: Medicaid Other | Admitting: Physician Assistant

## 2023-04-23 DIAGNOSIS — F411 Generalized anxiety disorder: Secondary | ICD-10-CM | POA: Diagnosis not present

## 2023-04-23 DIAGNOSIS — Z8659 Personal history of other mental and behavioral disorders: Secondary | ICD-10-CM | POA: Diagnosis not present

## 2023-04-23 DIAGNOSIS — F332 Major depressive disorder, recurrent severe without psychotic features: Secondary | ICD-10-CM | POA: Diagnosis not present

## 2023-04-23 NOTE — Progress Notes (Signed)
BH MD/PA/NP OP Progress Note  04/23/2023 11:37 AM Christie Hart  MRN:  [id number]  Chief Complaint:  Chief Complaint  Patient presents with   Follow-up   Medication Refill   HPI:   Christie Hart is a 28 year old female with a past psychiatric history significant for a history of ADHD, major depressive disorder (recurrent, severe, without psychotic features), and generalized anxiety disorder who presents to [facility] for follow-up and medication management.  Patient is currently being managed on the following psychiatric medications:  Atomoxetine 40 mg daily Escitalopram 10 mg daily Buspirone 7.5 mg 2 times daily  Patient presents to the encounter stating that she has been depressed for 2 weeks due to losing her job.  She reports that she lost her job due to testing positive for methamphetamine.  Patient reports that she has never taken methamphetamine and did not know what the drug was after being informed of her positive result.  Patient reports no issues or concerns regarding her current medication regimen.  She reports that she occasionally forgets to take her nightly medication but denies this being a significant impact.  Patient endorses depression due to losing her job.  She also reports being stressed out about her finances.  She denies experiencing anxiety.  Patient endorses the following depressive symptoms: feelings of sadness and difficulty getting out of bed.  A PHQ-9 screen was performed with the patient scoring a 5.  A GAD-7 screen was also performed with the patient scoring a 6.  Patient is alert and oriented x 4, calm, cooperative, and fully engaged in conversation during the encounter.  Patient endorses fine mood.  Patient exhibits depressed mood with congruent affect.  Patient denies suicidal or homicidal ideations.  She further denies auditory or visual hallucinations and does not appear to be responding to  internal/external stimuli.  Patient endorses good sleep and receives on average 7 hours of sleep per night.  Patient endorses good appetite and eats on average 3 meals per day.  Patient denies alcohol consumption, tobacco use, or illicit drug use.   Visit Diagnosis:    ICD-10-CM   1. Major depressive disorder, recurrent severe without psychotic features (HCC)  F33.2 escitalopram (LEXAPRO) 10 MG tablet    2. GAD (generalized anxiety disorder)  F41.1 escitalopram (LEXAPRO) 10 MG tablet    busPIRone (BUSPAR) 7.5 MG tablet    3. History of ADHD  Z86.59 atomoxetine (STRATTERA) 40 MG capsule       Past Psychiatric History:  Patient endorses a past psychiatric history significant for depression and ADHD.  Patient also reports that she was diagnosed with bipolar disorder in early age.   Patient endorses a past history of hospitalization due to mental health stating that she was hospitalized at [facility] in 2021 after a dispute with her son's father.  During the time of her hospitalization, the patient states that she kept reporting that she wanted to get away.   Patient denies a past history of suicide attempt   Patient denies a past history of homicide attempt  Past Medical History:  Past Medical History:  Diagnosis Date   ADHD (attention deficit hyperactivity disorder)    Bipolar affective disorder (HCC)    Depression    Hx of chlamydia infection    Sleep difficulties     Past Surgical History:  Procedure Laterality Date   DILATION AND EVACUATION N/A 05/16/2020   Procedure: DILATATION AND EVACUATION;  Surgeon: Carrington Clamp, MD;  Location[facility]  OR;  Service: Gynecology;  Laterality: N/A;   NO PAST SURGERIES      Family Psychiatric History:  Mother - anxiety, depression Father - anxiety, depression Sister - anxiety, depression   Family history of suicide attempt: Patient denies Family history of homicide attempt: Patient denies Family history of substance abuse: Patient  denies  Family History:  Family History  Problem Relation Age of Onset   Diabetes Mother    Hypertension Mother     Social History:  Social History   Socioeconomic History   Marital status: Single    Spouse name: Not on file   Number of children: Not on file   Years of education: Not on file   Highest education level: Not on file  Occupational History   Not on file  Tobacco Use   Smoking status: Never   Smokeless tobacco: Never  Vaping Use   Vaping status: Never Used  Substance and Sexual Activity   Alcohol use: No   Drug use: No   Sexual activity: Yes  Other Topics Concern   Not on file  Social History Narrative   Not on file   Social Drivers of Health   Financial Resource Strain: Medium Risk (01/25/2023)   Overall Financial Resource Strain (CARDIA)    Difficulty of Paying Living Expenses: Somewhat hard  Food Insecurity: No Food Insecurity (09/03/2021)   Received from [facility]   Hunger Vital Sign    Worried About Running Out of Food in the Last Year: Never true    Ran Out of Food in the Last Year: Never true  Transportation Needs: No Transportation Needs (01/25/2023)   PRAPARE - Administrator, Civil Service (Medical): No    Lack of Transportation (Non-Medical): No  Physical Activity: Inactive (01/25/2023)   Exercise Vital Sign    Days of Exercise per Week: 0 days    Minutes of Exercise per Session: 0 min  Stress: Stress Concern Present (01/25/2023)   [employer] of Occupational Health - Occupational Stress Questionnaire    Feeling of Stress : Very much  Social Connections: Socially Isolated (01/25/2023)   Social Connection and Isolation Panel [NHANES]    Frequency of Communication with Friends and Family: More than three times a week    Frequency of Social Gatherings with Friends and Family: Twice a week    Attends Religious Services: Never    Database administrator or Organizations: No    Attends Hospital doctor: Never    Marital Status: Never married    Allergies: No Known Allergies  Metabolic Disorder Labs: No results found for: "HGBA1C", "MPG" No results found for: "PROLACTIN" No results found for: "CHOL", "TRIG", "HDL", "CHOLHDL", "VLDL", "LDLCALC" Lab Results  Component Value Date   TSH 0.751 11/16/2017    Therapeutic Level Labs: No results found for: "LITHIUM" No results found for: "VALPROATE" No results found for: "CBMZ"  Current Medications: Current Outpatient Medications  Medication Sig Dispense Refill   amoxicillin-clavulanate (AUGMENTIN) 875-125 MG tablet Take 1 tablet by mouth every 12 (twelve) hours. 14 tablet 0   atomoxetine (STRATTERA) 40 MG capsule Take 1 capsule (40 mg total) by mouth daily. 30 capsule 1   benzonatate (TESSALON) 100 MG capsule Take 1 capsule (100 mg total) by mouth every 8 (eight) hours. 21 capsule 0   busPIRone (BUSPAR) 7.5 MG tablet Take 1 tablet (7.5 mg total) by mouth 2 (two) times daily. 60 tablet 1   erythromycin ophthalmic ointment Place  a 1/2 inch ribbon of ointment into the left lower eyelid lash line 3 times a day for 7 days 3.5 g 0   escitalopram (LEXAPRO) 10 MG tablet Take 1 tablet (10 mg total) by mouth daily. 30 tablet 1   ibuprofen (ADVIL) 600 MG tablet Take 1 tablet (600 mg total) by mouth every 6 (six) hours as needed. 30 tablet 0   loperamide (IMODIUM) 2 MG capsule Take 1 capsule (2 mg total) by mouth 4 (four) times daily as needed for diarrhea or loose stools. 12 capsule 0   medroxyPROGESTERone Acetate (DEPO-PROVERA) 150 MG/ML SUSY Inject 150 mg by intramuscular route.     ondansetron (ZOFRAN-ODT) 4 MG disintegrating tablet Take 1 tablet (4 mg total) by mouth every 8 (eight) hours as needed for nausea or vomiting. 20 tablet 0   No current facility-administered medications for this visit.     Musculoskeletal: Strength & Muscle Tone: within normal limits Gait & Station: normal Patient leans: N/A  Psychiatric Specialty  Exam: Review of Systems  Psychiatric/Behavioral:  Negative for decreased concentration, dysphoric mood, hallucinations, self-injury, sleep disturbance and suicidal ideas. The patient is nervous/anxious. The patient is not hyperactive.     Blood pressure (!) 141/78, pulse (!) 114, temperature 98.1 F (36.7 C), temperature source Oral, height 5\' 9"  (1.753 m), weight 148 lb 3.2 oz (67.2 kg), SpO2 99%, not currently breastfeeding.Body mass index is 21.89 kg/m.  General Appearance: Casual  Eye Contact:  Good  Speech:  Clear and Coherent and Normal Rate  Volume:  Normal  Mood:  Anxious  Affect:  Congruent  Thought Process:  Coherent, Goal Directed, and Descriptions of Associations: Intact  Orientation:  Full (Time, Place, and Person)  Thought Content: WDL   Suicidal Thoughts:  No  Homicidal Thoughts:  No  Memory:  Immediate;   Good Recent;   Good Remote;   Good  Judgement:  Good  Insight:  Good  Psychomotor Activity:  Normal  Concentration:  Concentration: Good and Attention Span: Good  Recall:  Fair  Fund of Knowledge: Good  Language: Good  Akathisia:  No  Handed:  Right  AIMS (if indicated): not done  Assets:  Communication Skills Desire for Improvement Financial Resources/Insurance Housing Transportation Vocational/Educational  ADL's:  Intact  Cognition: WNL  Sleep:  Good   Screenings: AIMS    Flowsheet Row [facility]  AIMS Total Score 0      AUDIT    Flowsheet Row [facility]  Alcohol Use Disorder Identification Test Final Score (AUDIT) 0      GAD-7    Flowsheet Row Clinical Support from 04/23/2023 in [facility] Clinical Support from 03/12/2023 in [facility] Office Visit from 01/26/2023 in [facility] Counselor from 01/25/2023 in  [facility]  Total GAD-7 Score 6 20 17 6       PHQ2-9    Flowsheet Row Clinical Support from 04/23/2023 in [facility] Clinical Support from 03/12/2023 in [facility] Office Visit from 01/26/2023 in [facility] Counselor from 01/25/2023 in [facility]  PHQ-2 Total Score 2 0 3 6  PHQ-9 Total Score 5 -- 15 16      Flowsheet Row Clinical Support from 04/23/2023 in [facility] Most recent reading at 04/23/2023 11:37 AM  ED from 04/22/2023 in [facility] Most recent reading at 04/22/2023  9:03 AM ED from 04/22/2023 in [facility]) Most recent reading at 04/22/2023  8:12 AM  C-SSRS RISK CATEGORY No Risk No Risk No Risk        Assessment and Plan:   Christie Hart is a 28 year old female with a past psychiatric history significant for a history of ADHD, major depressive disorder (recurrent, severe, without psychotic features), and generalized anxiety disorder who presents to [facility] for follow-up and medication management.  Patient presents to the encounter endorsing depressive symptoms attributed to recently losing her job after being drug tested and methamphetamine being found in her urine.  Patient reports that she has never used methamphetamine before.  Patient is currently taking atomoxetine for the management of her focus/concentration.  Through research, it was found that atomoxetine can cause a false positive for methamphetamine, though it is rare.  Provider to reach out to patient's place of employee to discuss what medications she is currently on.  Patient would like to continue taking her medications as prescribed and denies the need for dosage adjustments at this time.  Patient's  medications to be e-prescribed to pharmacy of choice.  Collaboration of Care: Collaboration of Care: Medication Management AEB provider managing patient's psychiatric medications, Psychiatrist AEB patient being followed by mental health provider at this facility, and Referral or follow-up with counselor/therapist AEB patient being seen by licensed clinical social worker at this facility  Patient/Guardian was advised Release of Information must be obtained prior to any record release in order to collaborate their care with an outside provider. Patient/Guardian was advised if they have not already done so to contact the registration department to sign all necessary forms in order for [place] to release information regarding their care.   Consent: Patient/Guardian gives verbal consent for treatment and assignment of benefits for services provided during this visit. Patient/Guardian expressed understanding and agreed to proceed.   1. Major depressive disorder, recurrent severe without psychotic features (HCC)  - escitalopram (LEXAPRO) 10 MG tablet; Take 1 tablet (10 mg total) by mouth daily.  Dispense: 30 tablet; Refill: 1  2. GAD (generalized anxiety disorder)  - escitalopram (LEXAPRO) 10 MG tablet; Take 1 tablet (10 mg total) by mouth daily.  Dispense: 30 tablet; Refill: 1 - busPIRone (BUSPAR) 7.5 MG tablet; Take 1 tablet (7.5 mg total) by mouth 2 (two) times daily.  Dispense: 60 tablet; Refill: 1  3. History of ADHD  - atomoxetine (STRATTERA) 40 MG capsule; Take 1 capsule (40 mg total) by mouth daily.  Dispense: 30 capsule; Refill: 1  Patient to follow up in 6 weeks Provider spent a total of 18 minutes with the patient/reviewing the patient's chart   Meta Hatchet, PA 04/23/2023, 11:37 AM

## 2023-04-25 MED ORDER — BUSPIRONE HCL 7.5 MG PO TABS: 7.5000 mg | ORAL_TABLET | Freq: Two times a day (BID) | ORAL | 1 refills | Status: DC

## 2023-04-25 MED ORDER — ESCITALOPRAM OXALATE 10 MG PO TABS: 10.0000 mg | ORAL_TABLET | Freq: Every day | ORAL | 1 refills | Status: DC

## 2023-04-25 MED ORDER — ATOMOXETINE HCL 40 MG PO CAPS: 40.0000 mg | ORAL_CAPSULE | Freq: Every day | ORAL | 1 refills | Status: DC

## 2023-04-26 ENCOUNTER — Encounter (HOSPITAL_COMMUNITY): Payer: Self-pay | Admitting: Physician Assistant

## 2023-04-28 ENCOUNTER — Telehealth (HOSPITAL_COMMUNITY): Payer: Self-pay | Admitting: Physician Assistant

## 2023-06-04 ENCOUNTER — Encounter (HOSPITAL_COMMUNITY): Payer: Medicaid Other | Admitting: Physician Assistant

## 2023-06-22 ENCOUNTER — Encounter (HOSPITAL_COMMUNITY): Payer: Self-pay | Admitting: Physician Assistant

## 2023-06-22 ENCOUNTER — Ambulatory Visit (HOSPITAL_COMMUNITY): Admitting: Physician Assistant

## 2023-06-22 DIAGNOSIS — F411 Generalized anxiety disorder: Secondary | ICD-10-CM | POA: Diagnosis not present

## 2023-06-22 DIAGNOSIS — Z8659 Personal history of other mental and behavioral disorders: Secondary | ICD-10-CM | POA: Diagnosis not present

## 2023-06-22 DIAGNOSIS — F332 Major depressive disorder, recurrent severe without psychotic features: Secondary | ICD-10-CM | POA: Diagnosis not present

## 2023-06-22 MED ORDER — BUSPIRONE HCL 7.5 MG PO TABS: 7.5000 mg | ORAL_TABLET | Freq: Two times a day (BID) | ORAL | 1 refills | Status: AC

## 2023-06-22 MED ORDER — ATOMOXETINE HCL 60 MG PO CAPS: 60.0000 mg | ORAL_CAPSULE | Freq: Every day | ORAL | 1 refills | Status: AC

## 2023-06-22 MED ORDER — ESCITALOPRAM OXALATE 10 MG PO TABS: 10.0000 mg | ORAL_TABLET | Freq: Every day | ORAL | 1 refills | Status: AC

## 2023-06-22 NOTE — Progress Notes (Signed)
 [facility] MD/PA/NP OP Progress Note  06/22/2023 5:26 PM Christie Hart  MRN:  [id number]  Chief Complaint:  Chief Complaint  Patient presents with   Follow-up   Medication Management   HPI:   Christie Hart is a 28 year old female with a past psychiatric history significant for a history of ADHD, major depressive disorder (recurrent, severe, without psychotic features), and generalized anxiety disorder who presents to [facility] for follow-up and medication management.  Patient is currently being managed on the following psychiatric medications:  Atomoxetine  40 mg daily Escitalopram  10 mg daily Buspirone  7.5 mg 2 times daily  Patient presents to the encounter stating that she has been managing her children on her own.  She reports that she recently started a new job at [employer] and is occasionally donating blood for money.  She reports that her anxiety has been okay; however, she endorses stress due to her partner recently violating his parole.  She reports that since he violated his parole, he will be in jail for 6 months.  Although her mother has been helping her out, patient reports that it is mostly her and her kids.  The patient reports that her anxiety has been okay, she reports that her pulse feels elevated.  She states that she occasionally feels like her chest is running fast but does not attribute it to anxiety.  She reports that there have been times where she went to go donate blood and she was turned away due to her elevated pulse.  Patient continues to endorse depression but attributed to taking care of her children on her own.  She reports that her depression has improved some daily use of her medications.  She rates her depression as 5 out of 10 with 10 being most severe.  She endorses depressive episodes 2 days out of the week with symptoms lasting part of the day.  Patient endorses the following depressive symptoms: feelings of  sadness, lack of motivation, and self-isolation.  A PHQ-9 screen was performed with the patient scoring a 4.  A GAD-7 screen was also performed the patient scored a 6.  Patient reports no issues or concerns regarding her use of her current medication regimen.  She does report that she occasionally experiences lack of focus/concentration even through the use of her atomoxetine .  She denies experiencing any adverse side effects from her use of atomoxetine  and is interested in adjusting the dosage to help with maintaining her focus and concentration.  Patient is alert and oriented x 4, calm, cooperative, and fully engaged in conversation during the encounter.  Patient states that she is fine but stressed out.  Patient exhibits depressed mood with appropriate affect.  Patient denies suicidal or homicidal ideation.  She further denies auditory or visual hallucinations and does not appear to be responding to internal/external stimuli.  She endorses fair sleep and received on average 6 to 7 hours of sleep per night.  She endorses good appetite and eats on average 2 meals per day.  Patient denies alcohol consumption, tobacco use, or illicit drug use.  Visit Diagnosis:    ICD-10-CM   1. GAD (generalized anxiety disorder)  F41.1 busPIRone  (BUSPAR ) 7.5 MG tablet    escitalopram  (LEXAPRO ) 10 MG tablet    propranolol  (INDERAL ) 10 MG tablet    2. History of ADHD  Z86.59 atomoxetine  (STRATTERA ) 60 MG capsule    3. Major depressive disorder, recurrent severe without psychotic features (HCC)  F33.2 escitalopram  (LEXAPRO ) 10  MG tablet       Past Psychiatric History:  Patient endorses a past psychiatric history significant for depression and ADHD.  Patient also reports that she was diagnosed with bipolar disorder in early age.   Patient endorses a past history of hospitalization due to mental health stating that she was hospitalized at [facility] in 2021 after a dispute with her son's father.  During the time of  her hospitalization, the patient states that she kept reporting that she wanted to get away.   Patient denies a past history of suicide attempt   Patient denies a past history of homicide attempt  Past Medical History:  Past Medical History:  Diagnosis Date   ADHD (attention deficit hyperactivity disorder)    Bipolar affective disorder (HCC)    Depression    Hx of chlamydia infection    Sleep difficulties     Past Surgical History:  Procedure Laterality Date   DILATION AND EVACUATION N/A 05/16/2020   Procedure: DILATATION AND EVACUATION;  Surgeon: Matt Song, MD;  Location: [facility] OR;  Service: Gynecology;  Laterality: N/A;   NO PAST SURGERIES      Family Psychiatric History:  Mother - anxiety, depression Father - anxiety, depression Sister - anxiety, depression   Family history of suicide attempt: Patient denies Family history of homicide attempt: Patient denies Family history of substance abuse: Patient denies  Family History:  Family History  Problem Relation Age of Onset   Diabetes Mother    Hypertension Mother     Social History:  Social History   Socioeconomic History   Marital status: Single    Spouse name: Not on file   Number of children: Not on file   Years of education: Not on file   Highest education level: Not on file  Occupational History   Not on file  Tobacco Use   Smoking status: Never   Smokeless tobacco: Never  Vaping Use   Vaping status: Never Used  Substance and Sexual Activity   Alcohol use: No   Drug use: No   Sexual activity: Yes  Other Topics Concern   Not on file  Social History Narrative   Not on file   Social Drivers of Health   Financial Resource Strain: Medium Risk (01/25/2023)   Overall Financial Resource Strain (CARDIA)    Difficulty of Paying Living Expenses: Somewhat hard  Food Insecurity: No Food Insecurity (09/03/2021)   [facility] from [facility]   Hunger Vital Sign    Worried About Running  Out of Food in the Last Year: Never true    Ran Out of Food in the Last Year: Never true  Transportation Needs: No Transportation Needs (01/25/2023)   PRAPARE - Administrator, Civil Service (Medical): No    Lack of Transportation (Non-Medical): No  Physical Activity: Inactive (01/25/2023)   Exercise Vital Sign    Days of Exercise per Week: 0 days    Minutes of Exercise per Session: 0 min  Stress: Stress Concern Present (01/25/2023)   [employer] of Occupational Health - Occupational Stress Questionnaire    Feeling of Stress : Very much  Social Connections: Socially Isolated (01/25/2023)   Social Connection and Isolation Panel [NHANES]    Frequency of Communication with Friends and Family: More than three times a week    Frequency of Social Gatherings with Friends and Family: Twice a week    Attends Religious Services: Never    Database administrator or Organizations:  No    Attends Banker Meetings: Never    Marital Status: Never married    Allergies: No Known Allergies  Metabolic Disorder Labs: No results found for: "HGBA1C", "MPG" No results found for: "PROLACTIN" No results found for: "CHOL", "TRIG", "HDL", "CHOLHDL", "VLDL", "LDLCALC" Lab Results  Component Value Date   TSH 0.751 11/16/2017    Therapeutic Level Labs: No results found for: "LITHIUM" No results found for: "VALPROATE" No results found for: "CBMZ"  Current Medications: Current Outpatient Medications  Medication Sig Dispense Refill   propranolol  (INDERAL ) 10 MG tablet Take 1 tablet (10 mg total) by mouth 2 (two) times daily. 60 tablet 1   amoxicillin -clavulanate (AUGMENTIN ) 875-125 MG tablet Take 1 tablet by mouth every 12 (twelve) hours. 14 tablet 0   atomoxetine  (STRATTERA ) 60 MG capsule Take 1 capsule (60 mg total) by mouth daily. 30 capsule 1   benzonatate  (TESSALON ) 100 MG capsule Take 1 capsule (100 mg total) by mouth every 8 (eight) hours. 21 capsule 0   busPIRone   (BUSPAR ) 7.5 MG tablet Take 1 tablet (7.5 mg total) by mouth 2 (two) times daily. 60 tablet 1   erythromycin  ophthalmic ointment Place a 1/2 inch ribbon of ointment into the left lower eyelid lash line 3 times a day for 7 days 3.5 g 0   escitalopram  (LEXAPRO ) 10 MG tablet Take 1 tablet (10 mg total) by mouth daily. 30 tablet 1   ibuprofen  (ADVIL ) 600 MG tablet Take 1 tablet (600 mg total) by mouth every 6 (six) hours as needed. 30 tablet 0   loperamide  (IMODIUM ) 2 MG capsule Take 1 capsule (2 mg total) by mouth 4 (four) times daily as needed for diarrhea or loose stools. 12 capsule 0   medroxyPROGESTERone Acetate (DEPO-PROVERA) 150 MG/ML SUSY Inject 150 mg by intramuscular route.     ondansetron  (ZOFRAN -ODT) 4 MG disintegrating tablet Take 1 tablet (4 mg total) by mouth every 8 (eight) hours as needed for nausea or vomiting. 20 tablet 0   No current facility-administered medications for this visit.     Musculoskeletal: Strength & Muscle Tone: within normal limits Gait & Station: normal Patient leans: N/A  Psychiatric Specialty Exam: Review of Systems  Psychiatric/Behavioral:  Negative for decreased concentration, dysphoric mood, hallucinations, self-injury, sleep disturbance and suicidal ideas. The patient is nervous/anxious. The patient is not hyperactive.     Blood pressure 134/79, pulse (!) 110, temperature (!) 97.5 F (36.4 C), temperature source Oral, height 5\' 9"  (1.753 m), weight 154 lb 12.8 oz (70.2 kg), SpO2 100%, not currently breastfeeding.Body mass index is 22.86 kg/m.  General Appearance: Casual  Eye Contact:  Good  Speech:  Clear and Coherent and Normal Rate  Volume:  Normal  Mood:  Anxious and Depressed  Affect:  Appropriate  Thought Process:  Coherent, Goal Directed, and Descriptions of Associations: Intact  Orientation:  Full (Time, Place, and Person)  Thought Content: WDL   Suicidal Thoughts:  No  Homicidal Thoughts:  No  Memory:  Immediate;   Good Recent;    Good Remote;   Good  Judgement:  Good  Insight:  Good  Psychomotor Activity:  Normal  Concentration:  Concentration: Good and Attention Span: Good  Recall:  Fair  Fund of Knowledge: Good  Language: Good  Akathisia:  No  Handed:  Right  AIMS (if indicated): not done  Assets:  Communication Skills Desire for Improvement Financial Resources/Insurance Housing Transportation Vocational/Educational  ADL's:  Intact  Cognition: WNL  Sleep:  Good  Screenings: AIMS    Flowsheet Row [facility]  AIMS Total Score 0      AUDIT    Flowsheet Row [facility]  Alcohol Use Disorder Identification Test Final Score (AUDIT) 0      GAD-7    Flowsheet Row Clinical Support from 06/22/2023 in [facility] Clinical Support from 04/23/2023 in [facility] Clinical Support from 03/12/2023 in [facility] Office Visit from 01/26/2023 in [facility] Counselor from 01/25/2023 in [facility]  Total GAD-7 Score 6 6 20 17 6       PHQ2-9    Flowsheet Row Clinical Support from 06/22/2023 in [facility] Clinical Support from 04/23/2023 in [facility] Clinical Support from 03/12/2023 in [facility] Office Visit from 01/26/2023 in [facility] Counselor from 01/25/2023 in [facility]  PHQ-2 Total Score 3 2 0 3 6  PHQ-9 Total Score 4 5 -- 15 16      Flowsheet Row Clinical Support from 06/22/2023 in [facility] Clinical Support from 04/23/2023 in [facility] ED from 04/22/2023 in [facility] at [facility]  C-SSRS RISK CATEGORY No Risk No Risk No Risk        Assessment and Plan:   Christie Hart is a 28 year old female with a past psychiatric history significant for a history of ADHD, major depressive disorder (recurrent, severe, without psychotic features), and generalized anxiety disorder who presents to [facility] for follow-up and medication management.  Patient presents to the encounter stating that she has been dealing with stress related to having to take care of her children on her own.  She reports that her partner recently violated his probation and ended up going to jail for 6 months.  Patient attributes her anxiety and depression to recent developments in her life.  A PHQ-9 screen was performed with the patient scoring of 4.  A GAD-7 screen was also performed with the patient scoring 6.  Despite her depression and anxiety, patient reports that the use of her medications have been extremely helpful in managing her symptoms.  Patient denies the need for dosage adjustments for her escitalopram  or buspirone .  Patient informed provider that she has been experiencing elevated heart rate/pulse not accompanied with her anxiety.  She reports that her pulse has been so high, but she has been turned away from being able to donate blood.  Patient reports that it does not feel like a severe issue at this time but is interested in any medication options.  Provider recommended patient be placed on propranolol  10 mg 2 times daily as needed for the management of her anxiety and elevated pulse.  Patient was agreeable to recommendation.  Patient discussed with provider issues regarding her inability to focus/concentrate.  She reports that her atomoxetine  has been helpful in the past but she continues to struggle with organization and staying on task.  Patient denies experiencing any adverse side effects from her use of atomoxetine  and is interested in  adjusting the dosage.  Provider recommended increasing her dosage of atomoxetine  from 40 mg to 60 mg daily for the management of her ADHD.  Patient was agreeable to recommendation.  Patient's  medications to be e-prescribed to pharmacy of choice.  Collaboration of Care: Collaboration of Care: Medication Management AEB provider managing patient's psychiatric medications, Psychiatrist AEB patient being followed by mental health provider at this facility, and Referral or follow-up with counselor/therapist AEB patient being seen by licensed clinical social worker at this facility  Patient/Guardian was advised Release of Information must be obtained prior to any record release in order to collaborate their care with an outside provider. Patient/Guardian was advised if they have not already done so to contact the registration department to sign all necessary forms in order for us  to release information regarding their care.   Consent: Patient/Guardian gives verbal consent for treatment and assignment of benefits for services provided during this visit. Patient/Guardian expressed understanding and agreed to proceed.   1. GAD (generalized anxiety disorder)  - busPIRone  (BUSPAR ) 7.5 MG tablet; Take 1 tablet (7.5 mg total) by mouth 2 (two) times daily.  Dispense: 60 tablet; Refill: 1 - escitalopram  (LEXAPRO ) 10 MG tablet; Take 1 tablet (10 mg total) by mouth daily.  Dispense: 30 tablet; Refill: 1 - propranolol  (INDERAL ) 10 MG tablet; Take 1 tablet (10 mg total) by mouth 2 (two) times daily.  Dispense: 60 tablet; Refill: 1  2. History of ADHD  - atomoxetine  (STRATTERA ) 60 MG capsule; Take 1 capsule (60 mg total) by mouth daily.  Dispense: 30 capsule; Refill: 1  3. Major depressive disorder, recurrent severe without psychotic features (HCC)  - escitalopram  (LEXAPRO ) 10 MG tablet; Take 1 tablet (10 mg total) by mouth daily.  Dispense: 30 tablet; Refill: 1  Patient to follow up in 6 weeks Provider spent a  total of 19 minutes with the patient/reviewing the patient's chart   Gates Kasal, PA 06/22/2023, 5:26 PM

## 2023-06-25 MED ORDER — PROPRANOLOL HCL 10 MG PO TABS: 10.0000 mg | ORAL_TABLET | Freq: Two times a day (BID) | ORAL | 1 refills | Status: DC

## 2023-06-26 ENCOUNTER — Other Ambulatory Visit (HOSPITAL_COMMUNITY): Payer: Self-pay | Admitting: Physician Assistant

## 2023-06-26 DIAGNOSIS — F411 Generalized anxiety disorder: Secondary | ICD-10-CM

## 2023-06-29 NOTE — Telephone Encounter (Signed)
 Message acknowledged and reviewed.

## 2023-08-03 ENCOUNTER — Encounter (HOSPITAL_COMMUNITY): Admitting: Physician Assistant
